# Patient Record
Sex: Female | Born: 1965 | Race: White | Hispanic: No | Marital: Married | Smoking: Never smoker
Health system: Southern US, Community
[De-identification: ages and names within clinical notes are randomized; demographics above are authoritative.]

## PROBLEM LIST (undated history)

## (undated) DIAGNOSIS — Z9889 Other specified postprocedural states: Secondary | ICD-10-CM

## (undated) HISTORY — PX: ANKLE SURGERY: SHX546

## (undated) HISTORY — PX: MOUTH SURGERY: SHX715

## (undated) HISTORY — DX: Other specified postprocedural states: Z98.890

## (undated) HISTORY — PX: COLONOSCOPY: SHX174

## (undated) HISTORY — PX: CHOLECYSTECTOMY: SHX55

---

## 2006-06-19 ENCOUNTER — Ambulatory Visit: Payer: Self-pay | Admitting: Unknown Physician Specialty

## 2007-09-01 ENCOUNTER — Ambulatory Visit: Payer: Self-pay | Admitting: Unknown Physician Specialty

## 2009-08-31 ENCOUNTER — Ambulatory Visit: Payer: Self-pay | Admitting: Unknown Physician Specialty

## 2009-09-02 ENCOUNTER — Ambulatory Visit: Payer: Self-pay | Admitting: Unknown Physician Specialty

## 2010-03-03 ENCOUNTER — Ambulatory Visit: Payer: Self-pay | Admitting: General Surgery

## 2010-09-20 ENCOUNTER — Ambulatory Visit: Payer: Self-pay | Admitting: Unknown Physician Specialty

## 2011-10-08 ENCOUNTER — Ambulatory Visit: Payer: Self-pay | Admitting: Unknown Physician Specialty

## 2013-03-25 ENCOUNTER — Ambulatory Visit: Payer: Self-pay | Admitting: Obstetrics and Gynecology

## 2014-11-23 ENCOUNTER — Ambulatory Visit: Payer: Self-pay | Admitting: Family Medicine

## 2014-11-23 LAB — LIPID PANEL
Cholesterol: 173 mg/dL (ref 0–200)
HDL: 65 mg/dL (ref 35–70)
LDL Cholesterol: 93 mg/dL
Triglycerides: 77 mg/dL (ref 40–160)

## 2014-11-23 LAB — HEPATIC FUNCTION PANEL
ALT: 11 U/L (ref 7–35)
AST: 15 U/L (ref 13–35)

## 2014-11-23 LAB — BASIC METABOLIC PANEL
BUN: 16 mg/dL (ref 4–21)
CREATININE: 0.8 mg/dL (ref 0.5–1.1)
Glucose: 94 mg/dL
Potassium: 4.5 mmol/L (ref 3.4–5.3)
SODIUM: 140 mmol/L (ref 137–147)

## 2014-11-23 LAB — CBC AND DIFFERENTIAL
HCT: 37 % (ref 36–46)
Hemoglobin: 13.6 g/dL (ref 12.0–16.0)
Platelets: 293 10*3/uL (ref 150–399)
WBC: 6 10^3/mL

## 2014-11-23 LAB — TSH: TSH: 4.22 u[IU]/mL (ref 0.41–5.90)

## 2015-07-19 DIAGNOSIS — B351 Tinea unguium: Secondary | ICD-10-CM | POA: Insufficient documentation

## 2015-07-19 DIAGNOSIS — E559 Vitamin D deficiency, unspecified: Secondary | ICD-10-CM | POA: Insufficient documentation

## 2015-07-19 DIAGNOSIS — R202 Paresthesia of skin: Secondary | ICD-10-CM | POA: Insufficient documentation

## 2015-07-19 DIAGNOSIS — M25551 Pain in right hip: Secondary | ICD-10-CM | POA: Insufficient documentation

## 2015-07-19 DIAGNOSIS — E538 Deficiency of other specified B group vitamins: Secondary | ICD-10-CM | POA: Insufficient documentation

## 2015-07-22 ENCOUNTER — Encounter: Payer: Self-pay | Admitting: Physician Assistant

## 2015-07-22 ENCOUNTER — Ambulatory Visit (INDEPENDENT_AMBULATORY_CARE_PROVIDER_SITE_OTHER): Payer: 59 | Admitting: Physician Assistant

## 2015-07-22 VITALS — BP 120/70 | HR 67 | Temp 98.1°F | Resp 16 | Ht 66.0 in | Wt 193.2 lb

## 2015-07-22 DIAGNOSIS — R635 Abnormal weight gain: Secondary | ICD-10-CM

## 2015-07-22 DIAGNOSIS — E559 Vitamin D deficiency, unspecified: Secondary | ICD-10-CM | POA: Diagnosis not present

## 2015-07-22 DIAGNOSIS — Z0000 Encounter for general adult medical examination without abnormal findings: Secondary | ICD-10-CM | POA: Diagnosis not present

## 2015-07-22 DIAGNOSIS — K5900 Constipation, unspecified: Secondary | ICD-10-CM

## 2015-07-22 DIAGNOSIS — E538 Deficiency of other specified B group vitamins: Secondary | ICD-10-CM | POA: Diagnosis not present

## 2015-07-22 NOTE — Patient Instructions (Signed)
Calorie Counting for Weight Loss Calories are energy you get from the things you eat and drink. Your body uses this energy to keep you going throughout the day. The number of calories you eat affects your weight. When you eat more calories than your body needs, your body stores the extra calories as fat. When you eat fewer calories than your body needs, your body burns fat to get the energy it needs. Calorie counting means keeping track of how many calories you eat and drink each day. If you make sure to eat fewer calories than your body needs, you should lose weight. In order for calorie counting to work, you will need to eat the number of calories that are right for you in a day to lose a healthy amount of weight per week. A healthy amount of weight to lose per week is usually 1-2 lb (0.5-0.9 kg). A dietitian can determine how many calories you need in a day and give you suggestions on how to reach your calorie goal.  WHAT IS MY MY PLAN? My goal is to have 1200-1500 calories per day.  If I have this many calories per day, I should lose around 1-2 pounds per week. WHAT DO I NEED TO KNOW ABOUT CALORIE COUNTING? In order to meet your daily calorie goal, you will need to:  Find out how many calories are in each food you would like to eat. Try to do this before you eat.  Decide how much of the food you can eat.  Write down what you ate and how many calories it had. Doing this is called keeping a food log. WHERE DO I FIND CALORIE INFORMATION? The number of calories in a food can be found on a Nutrition Facts label. Note that all the information on a label is based on a specific serving of the food. If a food does not have a Nutrition Facts label, try to look up the calories online or ask your dietitian for help. HOW DO I DECIDE HOW MUCH TO EAT? To decide how much of the food you can eat, you will need to consider both the number of calories in one serving and the size of one serving. This information  can be found on the Nutrition Facts label. If a food does not have a Nutrition Facts label, look up the information online or ask your dietitian for help. Remember that calories are listed per serving. If you choose to have more than one serving of a food, you will have to multiply the calories per serving by the amount of servings you plan to eat. For example, the label on a package of bread might say that a serving size is 1 slice and that there are 90 calories in a serving. If you eat 1 slice, you will have eaten 90 calories. If you eat 2 slices, you will have eaten 180 calories. HOW DO I KEEP A FOOD LOG? After each meal, record the following information in your food log:  What you ate.  How much of it you ate.  How many calories it had.  Then, add up your calories. Keep your food log near you, such as in a small notebook in your pocket. Another option is to use a mobile app or website. Some programs will calculate calories for you and show you how many calories you have left each time you add an item to the log. WHAT ARE SOME CALORIE COUNTING TIPS?  Use your calories on foods  and drinks that will fill you up and not leave you hungry. Some examples of this include foods like nuts and nut butters, vegetables, lean proteins, and high-fiber foods (more than 5 g fiber per serving).  Eat nutritious foods and avoid empty calories. Empty calories are calories you get from foods or beverages that do not have many nutrients, such as candy and soda. It is better to have a nutritious high-calorie food (such as an avocado) than a food with few nutrients (such as a bag of chips).  Know how many calories are in the foods you eat most often. This way, you do not have to look up how many calories they have each time you eat them.  Look out for foods that may seem like low-calorie foods but are really high-calorie foods, such as baked goods, soda, and fat-free candy.  Pay attention to calories in drinks.  Drinks such as sodas, specialty coffee drinks, alcohol, and juices have a lot of calories yet do not fill you up. Choose low-calorie drinks like water and diet drinks.  Focus your calorie counting efforts on higher calorie items. Logging the calories in a garden salad that contains only vegetables is less important than calculating the calories in a milk shake.  Find a way of tracking calories that works for you. Get creative. Most people who are successful find ways to keep track of how much they eat in a day, even if they do not count every calorie. WHAT ARE SOME PORTION CONTROL TIPS?  Know how many calories are in a serving. This will help you know how many servings of a certain food you can have.  Use a measuring cup to measure serving sizes. This is helpful when you start out. With time, you will be able to estimate serving sizes for some foods.  Take some time to put servings of different foods on your favorite plates, bowls, and cups so you know what a serving looks like.  Try not to eat straight from a bag or box. Doing this can lead to overeating. Put the amount you would like to eat in a cup or on a plate to make sure you are eating the right portion.  Use smaller plates, glasses, and bowls to prevent overeating. This is a quick and easy way to practice portion control. If your plate is smaller, less food can fit on it.  Try not to multitask while eating, such as watching TV or using your computer. If it is time to eat, sit down at a table and enjoy your food. Doing this will help you to start recognizing when you are full. It will also make you more aware of what and how much you are eating. HOW CAN I CALORIE COUNT WHEN EATING OUT?  Ask for smaller portion sizes or child-sized portions.  Consider sharing an entree and sides instead of getting your own entree.  If you get your own entree, eat only half. Ask for a box at the beginning of your meal and put the rest of your entree in  it so you are not tempted to eat it.  Look for the calories on the menu. If calories are listed, choose the lower calorie options.  Choose dishes that include vegetables, fruits, whole grains, low-fat dairy products, and lean protein. Focusing on smart food choices from each of the 5 food groups can help you stay on track at restaurants.  Choose items that are boiled, broiled, grilled, or steamed.  Choose  water, milk, unsweetened iced tea, or other drinks without added sugars. If you want an alcoholic beverage, choose a lower calorie option. For example, a regular margarita can have up to 700 calories and a glass of wine has around 150.  Stay away from items that are buttered, battered, fried, or served with cream sauce. Items labeled "crispy" are usually fried, unless stated otherwise.  Ask for dressings, sauces, and syrups on the side. These are usually very high in calories, so do not eat much of them.  Watch out for salads. Many people think salads are a healthy option, but this is often not the case. Many salads come with bacon, fried chicken, lots of cheese, fried chips, and dressing. All of these items have a lot of calories. If you want a salad, choose a garden salad and ask for grilled meats or steak. Ask for the dressing on the side, or ask for olive oil and vinegar or lemon to use as dressing.  Estimate how many servings of a food you are given. For example, a serving of cooked rice is  cup or about the size of half a tennis ball or one cupcake wrapper. Knowing serving sizes will help you be aware of how much food you are eating at restaurants. The list below tells you how big or small some common portion sizes are based on everyday objects.  1 oz--4 stacked dice.  3 oz--1 deck of cards.  1 tsp--1 dice.  1 Tbsp-- a Ping-Pong ball.  2 Tbsp--1 Ping-Pong ball.   cup--1 tennis ball or 1 cupcake wrapper.  1 cup--1 baseball.   This information is not intended to replace advice  given to you by your health care provider. Make sure you discuss any questions you have with your health care provider.   Document Released: 10/01/2005 Document Revised: 10/22/2014 Document Reviewed: 08/06/2013 Elsevier Interactive Patient Education 2016 [employer]. Exercising to [employer] Exercising can help you to lose weight. In order to lose weight through exercise, you need to do vigorous-intensity exercise. You can tell that you are exercising with vigorous intensity if you are breathing very hard and fast and cannot hold a conversation while exercising. Moderate-intensity exercise helps to maintain your current weight. You can tell that you are exercising at a moderate level if you have a higher heart rate and faster breathing, but you are still able to hold a conversation. HOW OFTEN SHOULD I EXERCISE? Choose an activity that you enjoy and set realistic goals. Your health care provider can help you to make an activity plan that works for you. Exercise regularly as directed by your health care provider. This may include:  Doing resistance training twice each week, such as:  Push-ups.  Sit-ups.  Lifting weights.  Using resistance bands.  Doing a given intensity of exercise for a given amount of time. Choose from these options:  150 minutes of moderate-intensity exercise every week.  75 minutes of vigorous-intensity exercise every week.  A mix of moderate-intensity and vigorous-intensity exercise every week. Children, pregnant women, people who are out of shape, people who are overweight, and older adults may need to consult a health care provider for individual recommendations. If you have any sort of medical condition, be sure to consult your health care provider before starting a new exercise program. WHAT ARE SOME ACTIVITIES THAT CAN HELP ME TO LOSE WEIGHT?   Walking at a rate of at least 4.5 miles an hour.  Jogging or running at a rate of  5 miles per hour.  Biking at a  rate of at least 10 miles per hour.  Lap swimming.  Roller-skating or in-line skating.  Cross-country skiing.  Vigorous competitive sports, such as football, basketball, and soccer.  Jumping rope.  Aerobic dancing. HOW CAN I BE MORE ACTIVE IN MY DAY-TO-DAY ACTIVITIES?  Use the stairs instead of the elevator.  Take a walk during your lunch break.  If you drive, park your car farther away from work or school.  If you take public transportation, get off one stop early and walk the rest of the way.  Make all of your phone calls while standing up and walking around.  Get up, stretch, and walk around every 30 minutes throughout the day. WHAT GUIDELINES SHOULD I FOLLOW WHILE EXERCISING?  Do not exercise so much that you hurt yourself, feel dizzy, or get very short of breath.  Consult your health care provider prior to starting a new exercise program.  Wear comfortable clothes and shoes with good support.  Drink plenty of water while you exercise to prevent dehydration or heat stroke. Body water is lost during exercise and must be replaced.  Work out until you breathe faster and your heart beats faster.   This information is not intended to replace advice given to you by your health care provider. Make sure you discuss any questions you have with your health care provider.   Document Released: 11/03/2010 Document Revised: 10/22/2014 Document Reviewed: 03/04/2014 Elsevier Interactive Patient Education [employer].
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
## 2015-07-23 LAB — THYROID PANEL WITH TSH
Free Thyroxine Index: 2 (ref 1.2–4.9)
T3 UPTAKE RATIO: 26 % (ref 24–39)
T4, Total: 7.8 ug/dL (ref 4.5–12.0)
TSH: 1.95 u[IU]/mL (ref 0.450–4.500)

## 2015-07-23 LAB — VITAMIN B12: Vitamin B-12: 1172 pg/mL — ABNORMAL HIGH (ref 211–946)

## 2015-07-23 LAB — FSH/LH
FSH: 6.4 m[IU]/mL
LH: 12.5 m[IU]/mL

## 2015-07-23 LAB — VITAMIN D 25 HYDROXY (VIT D DEFICIENCY, FRACTURES): VIT D 25 HYDROXY: 41.1 ng/mL (ref 30.0–100.0)

## 2015-07-25 ENCOUNTER — Telehealth: Payer: Self-pay

## 2015-07-25 NOTE — Telephone Encounter (Signed)
-----   Message from Mar Daring, PA-C sent at 07/25/2015  9:40 AM EDT ----- All labs are WNL.  [facility] and LH show not in menopause yet, showing follicular stage of menstrual cycle.  Vit B12 is elevated.  May discontinue B12 supplement and we will recheck.

## 2015-07-25 NOTE — Telephone Encounter (Signed)
Advised pt as directed. Pt verbalized fully understanding.  Thanks,   

## 2015-07-25 NOTE — Telephone Encounter (Signed)
Advised pt as directed. Pt verbalized fully understanding. Pt wants to know when to recheck her lab work.  Thanks,

## 2015-07-25 NOTE — Telephone Encounter (Signed)
We can recheck in 3-6 months.

## 2015-12-14 ENCOUNTER — Encounter: Payer: Self-pay | Admitting: Family Medicine

## 2016-07-30 ENCOUNTER — Ambulatory Visit: Payer: 59 | Admitting: Physician Assistant

## 2016-08-02 ENCOUNTER — Ambulatory Visit (INDEPENDENT_AMBULATORY_CARE_PROVIDER_SITE_OTHER): Payer: 59 | Admitting: Physician Assistant

## 2016-08-02 ENCOUNTER — Encounter: Payer: Self-pay | Admitting: Physician Assistant

## 2016-08-02 VITALS — BP 110/70 | HR 76 | Temp 98.5°F | Resp 16 | Ht 66.0 in | Wt 187.0 lb

## 2016-08-02 DIAGNOSIS — Z6830 Body mass index (BMI) 30.0-30.9, adult: Secondary | ICD-10-CM | POA: Diagnosis not present

## 2016-08-02 DIAGNOSIS — N951 Menopausal and female climacteric states: Secondary | ICD-10-CM | POA: Diagnosis not present

## 2016-08-02 DIAGNOSIS — Z713 Dietary counseling and surveillance: Secondary | ICD-10-CM

## 2016-08-02 DIAGNOSIS — E6609 Other obesity due to excess calories: Secondary | ICD-10-CM

## 2016-08-02 DIAGNOSIS — E66811 Obesity, class 1: Secondary | ICD-10-CM

## 2016-08-02 NOTE — Progress Notes (Signed)
       Patient: Nina Dunn Female    DOB: 1965-11-29   50 y.o.   MRN: [id number] Visit Date: 08/02/2016  Today's Provider: Mar Daring, PA-C   Chief Complaint  Patient presents with  . Follow-up    weight    Subjective:    HPI  Follow up for weight  The patient was last seen for this 1 years ago. Changes made at last visit include pt to continue healthy lifestyle changes. She has been eating healthy, portion controlling and exercising at the [facility] (pilates and water aerobics).   She reports excellent compliance with treatment. She feels that condition is Unchanged.  She also complains of irregular menstruation. She does have an IUD. She is seen by [facility]. She used to have normal cycles, but of recent has had her current menstrual cycle for 3-4 weeks. It has only been spotting. She is going to call [facility] for follow-up. Sees Alicia Copland, PA-C. ------------------------------------------------------------------------------------       No Known Allergies  No current outpatient prescriptions on file.  Review of Systems  Constitutional: Negative.   Respiratory: Negative.   Cardiovascular: Negative.   Gastrointestinal: Negative.   Genitourinary: Positive for menstrual problem.  Neurological: Negative.     Social History  Substance Use Topics  . Smoking status: Never Smoker  . Smokeless tobacco: Never Used  . Alcohol use No   Objective:   BP 110/70 (BP Location: Left Arm, Patient Position: Sitting, Cuff Size: Large)   Pulse 76   Temp 98.5 F (36.9 C) (Oral)   Resp 16   Ht 5\' 6"  (1.676 m)   Wt 187 lb (84.8 kg)   LMP 07/04/2016   BMI 30.18 kg/m   Physical Exam  Constitutional: She appears well-developed and well-nourished. No distress.  Neck: Normal range of motion. Neck supple. No tracheal deviation present. No thyromegaly present.  Cardiovascular: Normal rate, regular rhythm and normal heart sounds.  Exam reveals no gallop and no  friction rub.   No murmur heard. Pulmonary/Chest: Effort normal and breath sounds normal. No respiratory distress. She has no wheezes. She has no rales.  Musculoskeletal: She exhibits no edema.  Lymphadenopathy:    She has no cervical adenopathy.  Skin: She is not diaphoretic.  Psychiatric: She has a normal mood and affect. Her behavior is normal. Judgment and thought content normal.  Vitals reviewed.     Assessment & Plan:     1. Encounter for weight loss counseling Biometric appeal form completed for patient.   2. Class 1 obesity due to excess calories without serious comorbidity with body mass index (BMI) of 30.0 to 30.9 in adult Doing well. Down 5 pounds from previous. Biometric appeals form completed.  3. Perimenopause Most likely perimenopausal symptoms. She is going to follow up with [facility].       Mar Daring, PA-C  [facility]

## 2016-08-02 NOTE — Patient Instructions (Signed)

## 2016-12-06 DIAGNOSIS — K2900 Acute gastritis without bleeding: Secondary | ICD-10-CM | POA: Insufficient documentation

## 2016-12-06 DIAGNOSIS — D369 Benign neoplasm, unspecified site: Secondary | ICD-10-CM | POA: Insufficient documentation

## 2016-12-13 ENCOUNTER — Emergency Department (HOSPITAL_COMMUNITY): Payer: 59

## 2016-12-13 ENCOUNTER — Encounter (HOSPITAL_COMMUNITY): Payer: Self-pay | Admitting: *Deleted

## 2016-12-13 ENCOUNTER — Emergency Department (HOSPITAL_COMMUNITY)
Admission: EM | Admit: 2016-12-13 | Discharge: 2016-12-13 | Disposition: A | Discharge status: Home / Self Care | Payer: 59 | Attending: Emergency Medicine | Admitting: Emergency Medicine

## 2016-12-13 DIAGNOSIS — Z79899 Other long term (current) drug therapy: Secondary | ICD-10-CM | POA: Diagnosis not present

## 2016-12-13 DIAGNOSIS — R0789 Other chest pain: Secondary | ICD-10-CM | POA: Diagnosis not present

## 2016-12-13 DIAGNOSIS — R0781 Pleurodynia: Secondary | ICD-10-CM | POA: Diagnosis present

## 2016-12-13 DIAGNOSIS — R079 Chest pain, unspecified: Secondary | ICD-10-CM

## 2016-12-13 LAB — BASIC METABOLIC PANEL
Anion gap: 7 (ref 5–15)
BUN: 14 mg/dL (ref 6–20)
CHLORIDE: 106 mmol/L (ref 101–111)
CO2: 24 mmol/L (ref 22–32)
Calcium: 9 mg/dL (ref 8.9–10.3)
Creatinine, Ser: 0.83 mg/dL (ref 0.44–1.00)
Glucose, Bld: 95 mg/dL (ref 65–99)
POTASSIUM: 3.7 mmol/L (ref 3.5–5.1)
SODIUM: 137 mmol/L (ref 135–145)

## 2016-12-13 LAB — CBC
HEMATOCRIT: 36.7 % (ref 36.0–46.0)
Hemoglobin: 12.5 g/dL (ref 12.0–15.0)
MCH: 30.8 pg (ref 26.0–34.0)
MCHC: 34.1 g/dL (ref 30.0–36.0)
MCV: 90.4 fL (ref 78.0–100.0)
PLATELETS: 275 10*3/uL (ref 150–400)
RBC: 4.06 MIL/uL (ref 3.87–5.11)
RDW: 12.9 % (ref 11.5–15.5)
WBC: 7 10*3/uL (ref 4.0–10.5)

## 2016-12-13 LAB — I-STAT TROPONIN, ED: Troponin i, poc: 0 ng/mL (ref 0.00–0.08)
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
## 2016-12-13 NOTE — ED Triage Notes (Signed)
Pt co R sided chest pain x 3 days, described as sharp shooting pains. Pt reports worsening pain tonight that woke her from sleep

## 2016-12-21 ENCOUNTER — Ambulatory Visit: Payer: Self-pay | Admitting: Advanced Practice Midwife

## 2017-01-08 ENCOUNTER — Other Ambulatory Visit: Payer: Self-pay | Admitting: Obstetrics and Gynecology

## 2017-01-08 DIAGNOSIS — Z1231 Encounter for screening mammogram for malignant neoplasm of breast: Secondary | ICD-10-CM

## 2017-01-10 ENCOUNTER — Ambulatory Visit (INDEPENDENT_AMBULATORY_CARE_PROVIDER_SITE_OTHER): Payer: 59 | Admitting: Advanced Practice Midwife

## 2017-01-10 ENCOUNTER — Ambulatory Visit: Admission: RE | Admit: 2017-01-10 | Payer: 59 | Source: Ambulatory Visit

## 2017-01-10 ENCOUNTER — Encounter: Payer: Self-pay | Admitting: Advanced Practice Midwife

## 2017-01-10 VITALS — BP 116/74 | HR 64 | Ht 66.0 in | Wt 194.0 lb

## 2017-01-10 DIAGNOSIS — Z124 Encounter for screening for malignant neoplasm of cervix: Secondary | ICD-10-CM

## 2017-01-10 DIAGNOSIS — Z01419 Encounter for gynecological examination (general) (routine) without abnormal findings: Secondary | ICD-10-CM

## 2017-01-10 DIAGNOSIS — R0789 Other chest pain: Secondary | ICD-10-CM | POA: Insufficient documentation
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
## 2017-01-13 LAB — PAP IG (IMAGE GUIDED): PAP Smear Comment: 0

## 2017-01-14 ENCOUNTER — Ambulatory Visit
Admission: RE | Admit: 2017-01-14 | Discharge: 2017-01-14 | Disposition: A | Discharge status: Home / Self Care | Payer: 59 | Source: Ambulatory Visit | Attending: Obstetrics and Gynecology | Admitting: Obstetrics and Gynecology

## 2017-01-14 DIAGNOSIS — Z1231 Encounter for screening mammogram for malignant neoplasm of breast: Secondary | ICD-10-CM | POA: Insufficient documentation

## 2017-01-15 ENCOUNTER — Encounter: Payer: Self-pay | Admitting: Obstetrics and Gynecology

## 2017-02-01 ENCOUNTER — Ambulatory Visit: Payer: 59

## 2018-01-25 IMAGING — DX DG ABDOMEN 1V
1 series · 1 of 1 positions shown · non-contrast
Comparison: None.

CLINICAL DATA: RIGHT-sided abdominal epigastric pain for 3 days.

EXAM:
ABDOMEN - 1 VIEW

[abdomen kub]
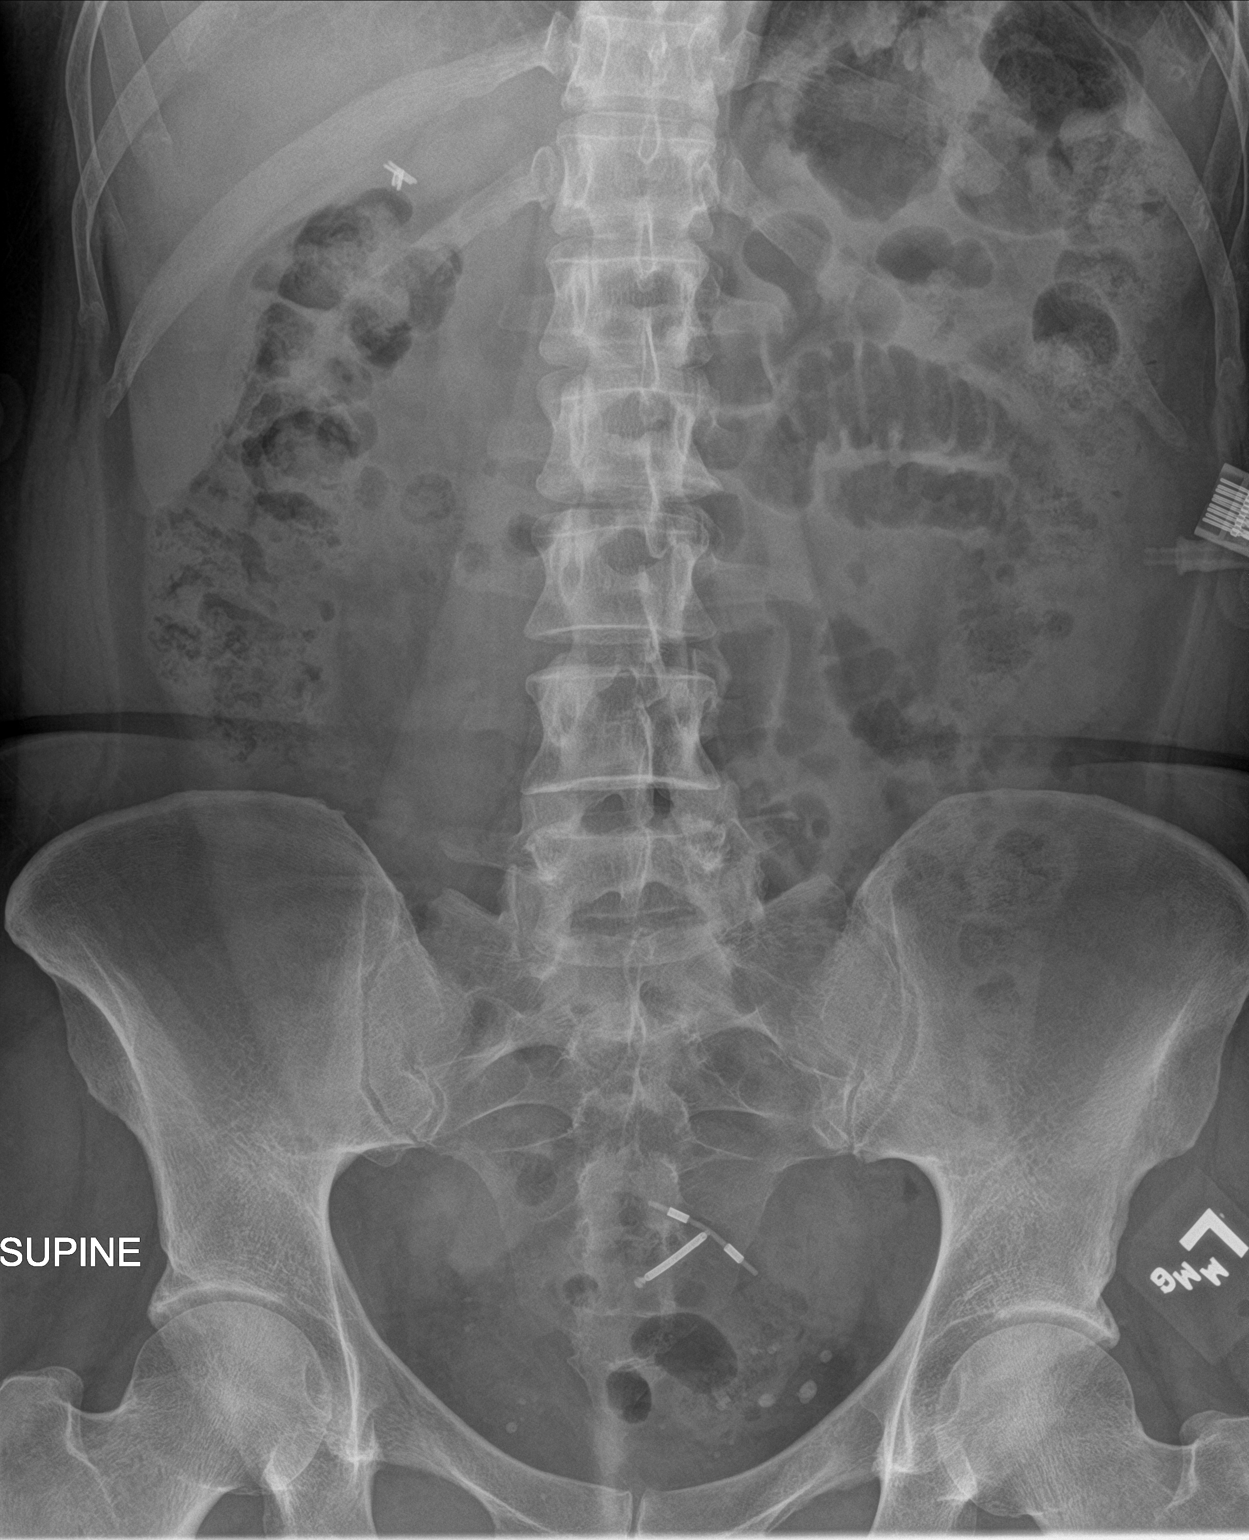

[1 of 1 positions shown; findings below may reference images not displayed]

FINDINGS: Bowel gas pattern is nondilated and nonobstructive. No
intra-abdominal mass effect or pathologic calcifications.
Phleboliths project in the pelvis. IUD in central pelvis. Surgical
clips in the included right abdomen compatible with cholecystectomy.
Soft tissue planes and included osseous structures are normal.
IMPRESSION: Normal bowel gas pattern.

## 2018-01-25 IMAGING — CR DG CHEST 2V
2 series · 2 of 2 positions shown · non-contrast
Comparison: None.

CLINICAL DATA: Acute onset of right-sided chest pain. Initial
encounter.

EXAM:
CHEST  2 VIEW

[chest pa]
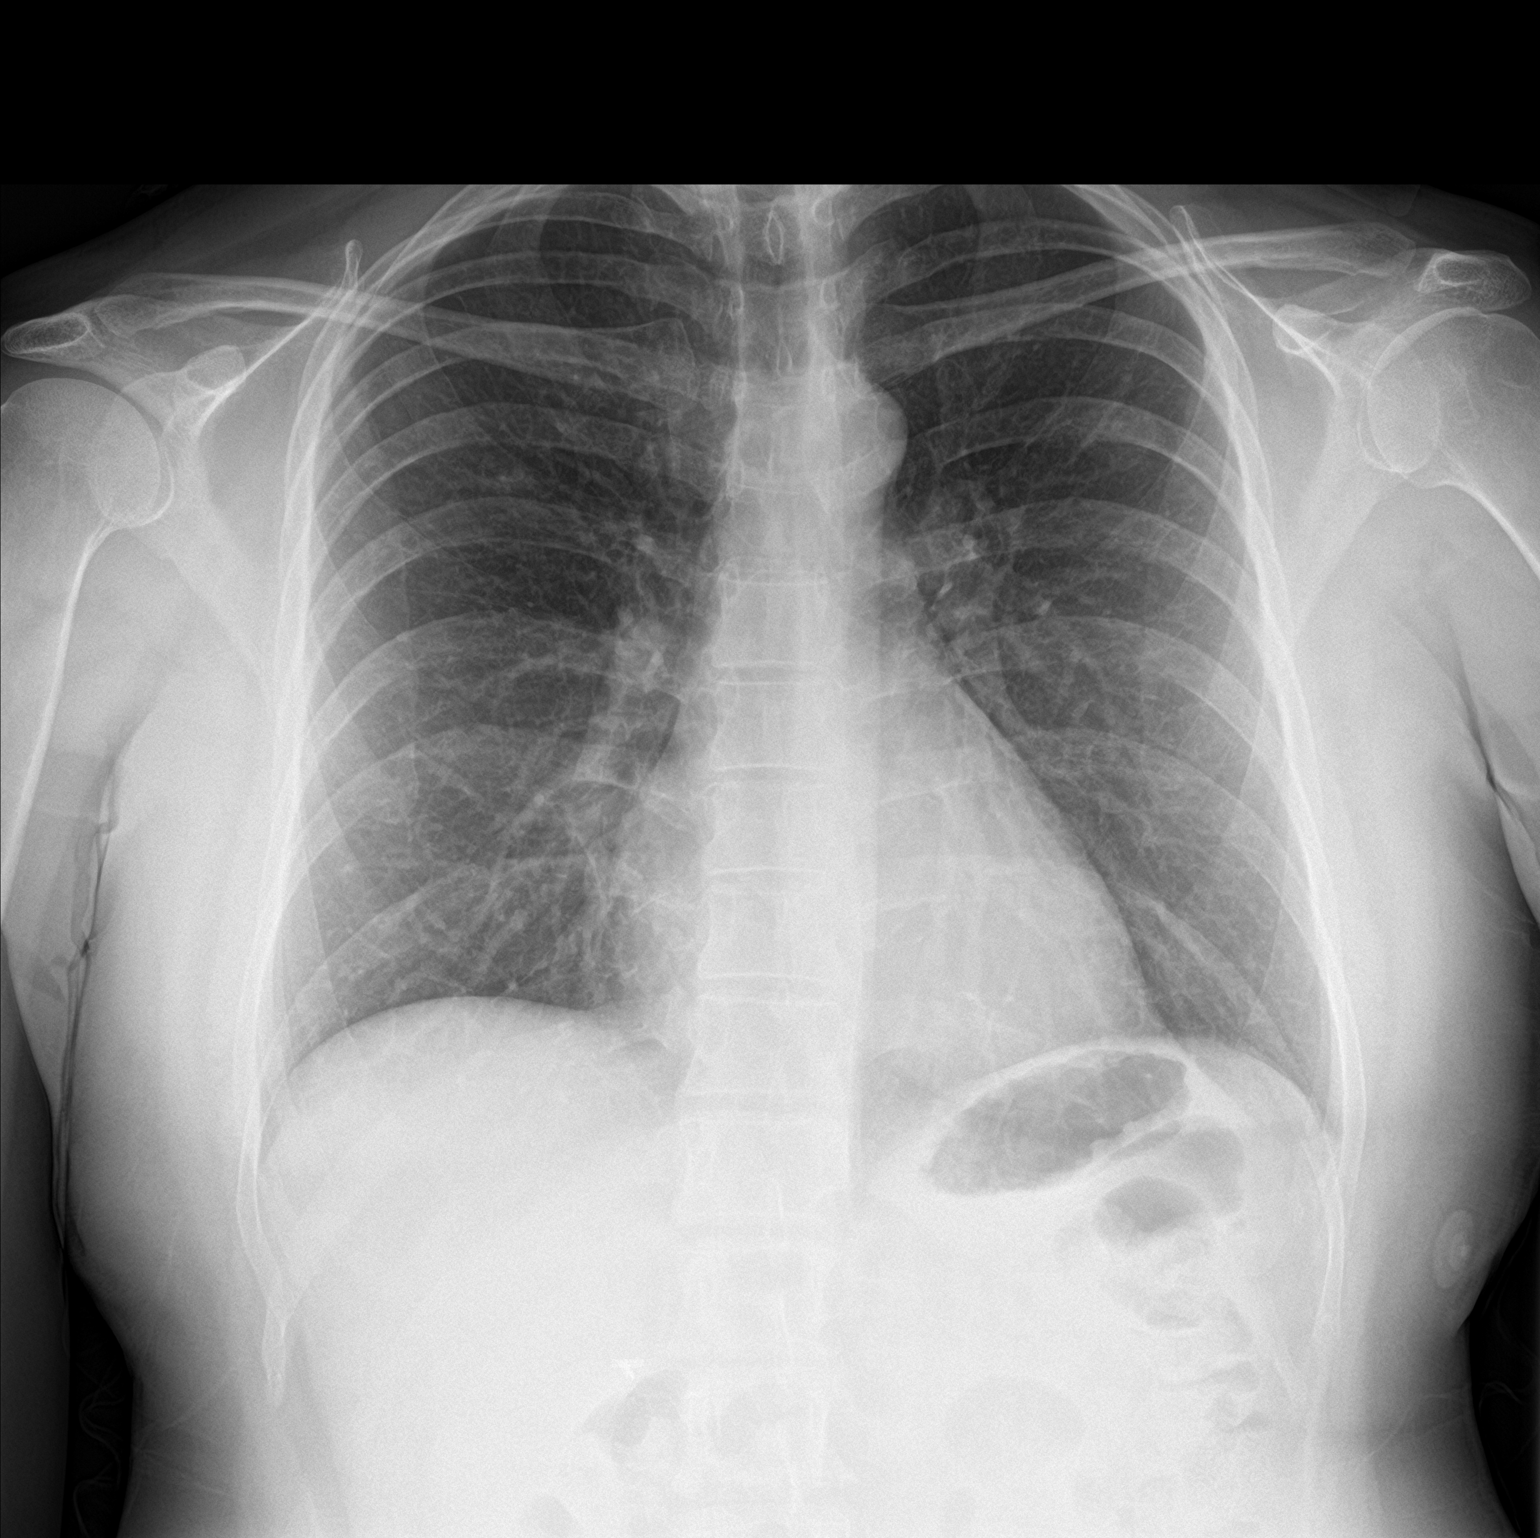

[chest lat]
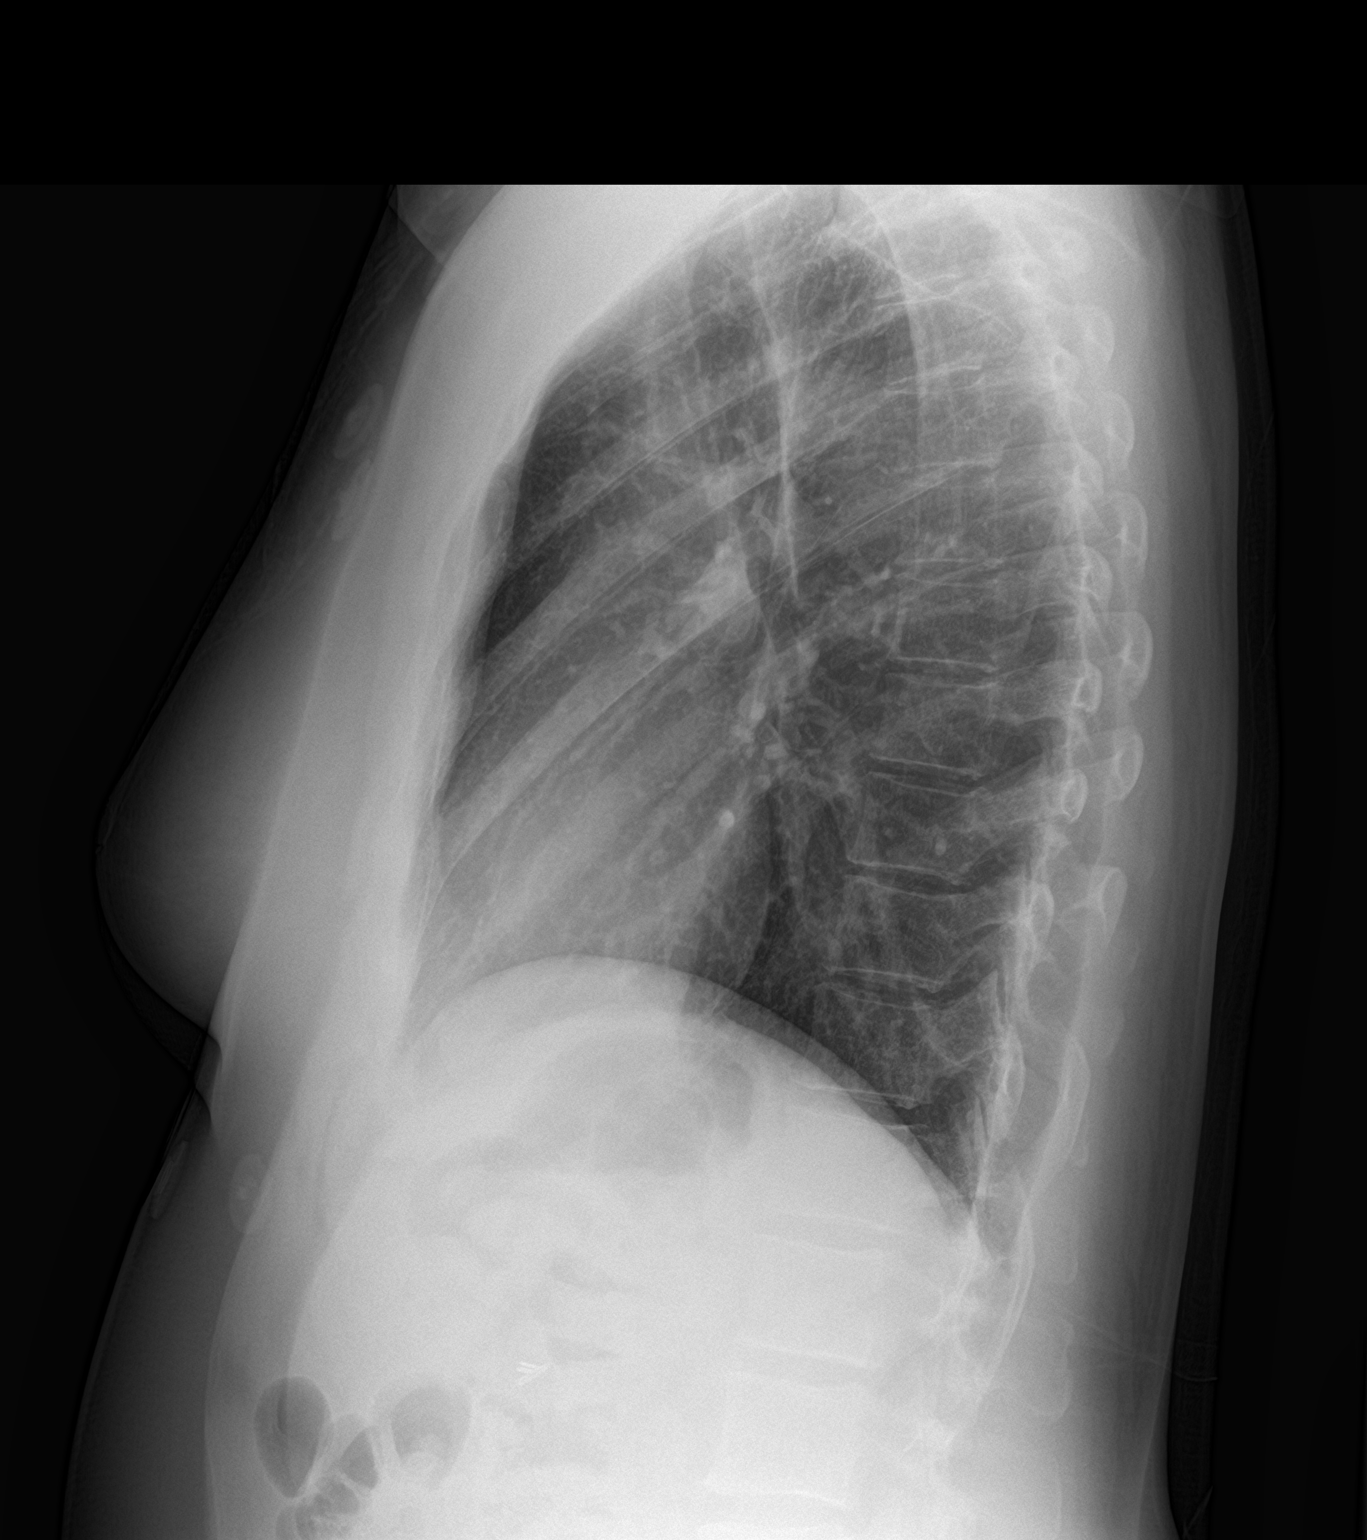

[2 of 2 positions shown; findings below may reference images not displayed]

FINDINGS: The lungs are well-aerated and clear. There is no evidence of focal
opacification, pleural effusion or pneumothorax.

The heart is normal in size; the mediastinal contour is within
normal limits. No acute osseous abnormalities are seen.
IMPRESSION: No acute cardiopulmonary process seen. No displaced rib fractures
identified.

## 2019-04-28 ENCOUNTER — Other Ambulatory Visit: Payer: Self-pay | Admitting: Internal Medicine

## 2019-04-28 DIAGNOSIS — Z1231 Encounter for screening mammogram for malignant neoplasm of breast: Secondary | ICD-10-CM

## 2019-05-11 ENCOUNTER — Ambulatory Visit
Admission: RE | Admit: 2019-05-11 | Discharge: 2019-05-11 | Disposition: A | Discharge status: Home / Self Care | Payer: Managed Care, Other (non HMO) | Source: Ambulatory Visit | Attending: Internal Medicine | Admitting: Internal Medicine

## 2019-05-11 ENCOUNTER — Encounter (INDEPENDENT_AMBULATORY_CARE_PROVIDER_SITE_OTHER): Payer: Self-pay

## 2019-05-11 ENCOUNTER — Other Ambulatory Visit: Payer: Self-pay

## 2019-05-11 DIAGNOSIS — Z1231 Encounter for screening mammogram for malignant neoplasm of breast: Secondary | ICD-10-CM | POA: Insufficient documentation

## 2019-05-13 ENCOUNTER — Encounter: Payer: Self-pay | Admitting: Obstetrics & Gynecology

## 2019-05-13 ENCOUNTER — Other Ambulatory Visit: Payer: Self-pay | Admitting: Internal Medicine

## 2019-05-13 ENCOUNTER — Ambulatory Visit (INDEPENDENT_AMBULATORY_CARE_PROVIDER_SITE_OTHER): Payer: Managed Care, Other (non HMO) | Admitting: Obstetrics & Gynecology

## 2019-05-13 ENCOUNTER — Other Ambulatory Visit: Payer: Self-pay

## 2019-05-13 VITALS — BP 120/80 | Ht 67.0 in | Wt 194.0 lb

## 2019-05-13 DIAGNOSIS — R928 Other abnormal and inconclusive findings on diagnostic imaging of breast: Secondary | ICD-10-CM

## 2019-05-13 DIAGNOSIS — Z01419 Encounter for gynecological examination (general) (routine) without abnormal findings: Secondary | ICD-10-CM | POA: Diagnosis not present

## 2019-05-13 DIAGNOSIS — N6489 Other specified disorders of breast: Secondary | ICD-10-CM

## 2019-05-13 NOTE — Patient Instructions (Addendum)
PAP every three years, due 2021 Mammogram every year    Call [phone] to schedule at [facility] Colonoscopy every 3 years per gastroenterology Labs yearly (with PCP)
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
## 2019-05-22 ENCOUNTER — Ambulatory Visit
Admission: RE | Admit: 2019-05-22 | Discharge: 2019-05-22 | Disposition: A | Discharge status: Home / Self Care | Payer: Managed Care, Other (non HMO) | Source: Ambulatory Visit | Attending: Internal Medicine | Admitting: Internal Medicine

## 2019-05-22 ENCOUNTER — Other Ambulatory Visit: Payer: Self-pay

## 2019-05-22 DIAGNOSIS — N6489 Other specified disorders of breast: Secondary | ICD-10-CM | POA: Diagnosis present

## 2019-05-22 DIAGNOSIS — R928 Other abnormal and inconclusive findings on diagnostic imaging of breast: Secondary | ICD-10-CM

## 2020-06-13 ENCOUNTER — Encounter: Payer: Self-pay | Admitting: Obstetrics & Gynecology

## 2020-06-13 ENCOUNTER — Other Ambulatory Visit: Payer: Self-pay

## 2020-06-13 ENCOUNTER — Ambulatory Visit (INDEPENDENT_AMBULATORY_CARE_PROVIDER_SITE_OTHER): Payer: Managed Care, Other (non HMO) | Admitting: Obstetrics & Gynecology

## 2020-06-13 ENCOUNTER — Other Ambulatory Visit (HOSPITAL_COMMUNITY)
Admission: RE | Admit: 2020-06-13 | Discharge: 2020-06-13 | Disposition: A | Discharge status: Home / Self Care | Payer: Managed Care, Other (non HMO) | Source: Ambulatory Visit | Attending: Obstetrics & Gynecology | Admitting: Obstetrics & Gynecology

## 2020-06-13 VITALS — BP 130/80 | Ht 67.0 in | Wt 197.0 lb

## 2020-06-13 DIAGNOSIS — Z01419 Encounter for gynecological examination (general) (routine) without abnormal findings: Secondary | ICD-10-CM

## 2020-06-13 DIAGNOSIS — Z780 Asymptomatic menopausal state: Secondary | ICD-10-CM

## 2020-06-13 DIAGNOSIS — Z1231 Encounter for screening mammogram for malignant neoplasm of breast: Secondary | ICD-10-CM | POA: Diagnosis not present

## 2020-06-13 DIAGNOSIS — Z124 Encounter for screening for malignant neoplasm of cervix: Secondary | ICD-10-CM | POA: Insufficient documentation
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
## 2020-06-13 NOTE — Addendum Note (Signed)
Addended by: Gae Dry on: 06/13/2020 02:18 PM   Modules accepted: Orders

## 2020-06-13 NOTE — Patient Instructions (Signed)
PAP every three years °Mammogram every year °   Call [phone] to schedule at [place] °Colonoscopy every 3 years °Labs yearly (with PCP) ° °Thank you for choosing [facility]. As part of our ongoing efforts to improve patient experience, we would appreciate your feedback. Please fill out the short survey that you will receive by mail or MyChart. Your opinion is important to us! °- Dr. Dierre Crevier ° °

## 2020-06-15 LAB — CYTOLOGY - PAP
Comment: NEGATIVE
Diagnosis: NEGATIVE
High risk HPV: NEGATIVE

## 2020-07-03 IMAGING — MG DIGITAL DIAGNOSTIC BILATERAL MAMMOGRAM WITH TOMO AND CAD
8 series · 8 of 24 positions shown · non-contrast
Comparison: Previous exam(s).

CLINICAL DATA: Patient was recalled from screening for bilateral
asymmetries

EXAM:
DIGITAL DIAGNOSTIC BILATERAL MAMMOGRAM WITH CAD AND TOMO

[R ML synth-2D]
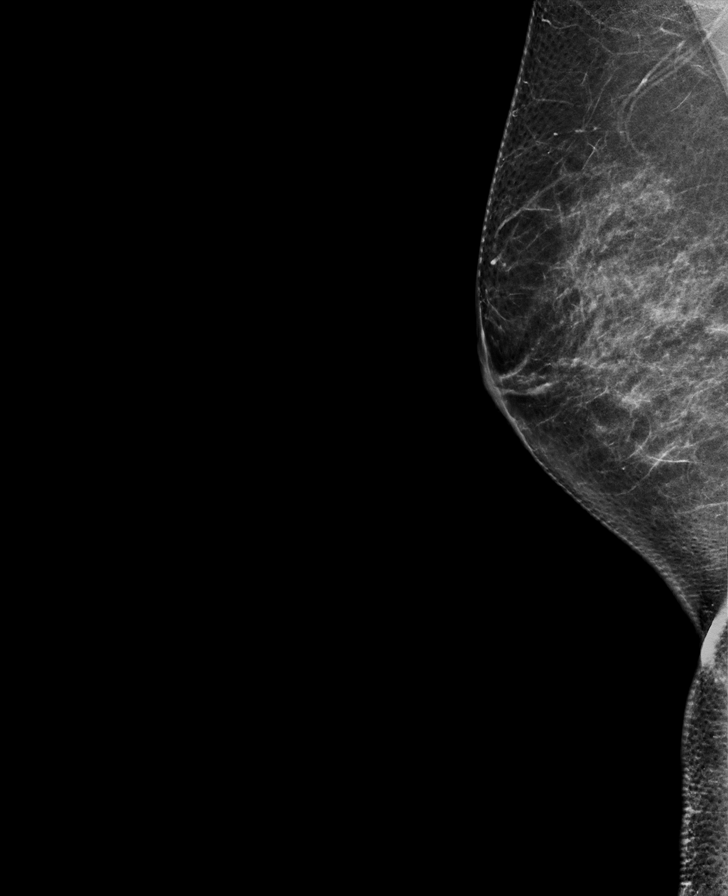

[R MLO synth-2D]
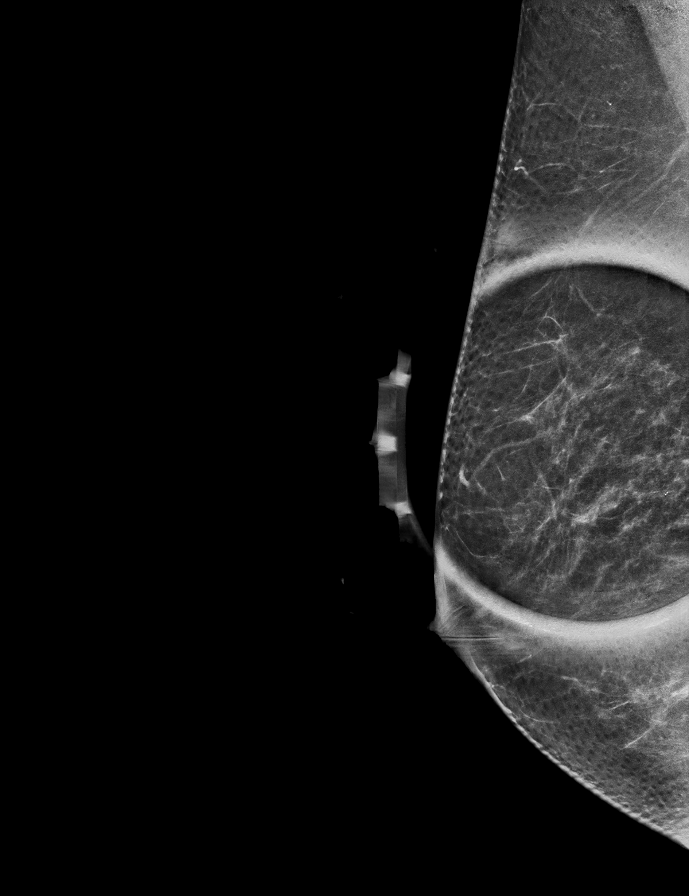

[L ML synth-2D]
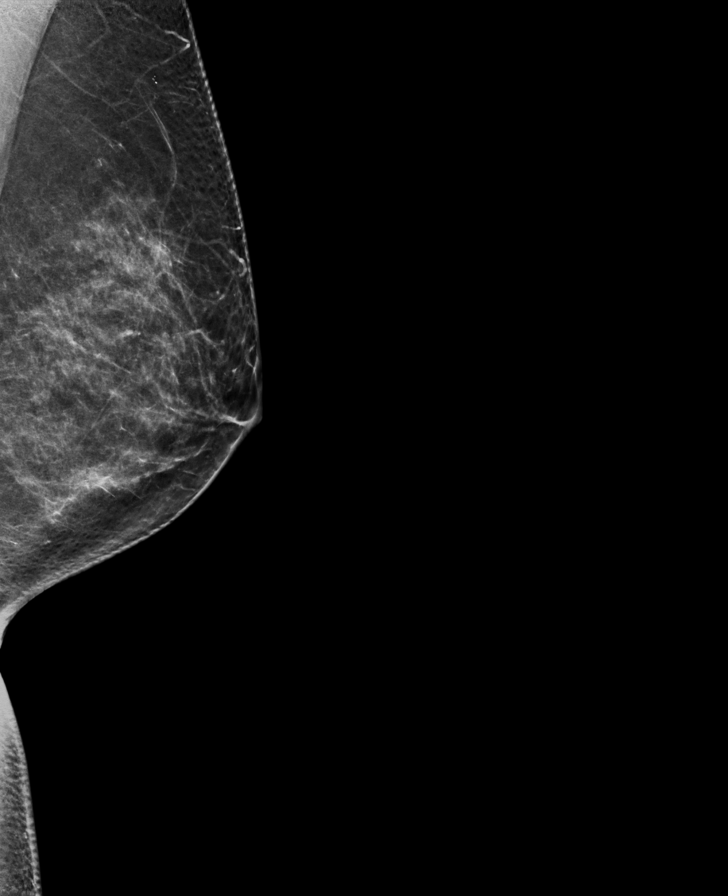

[L MLO synth-2D]
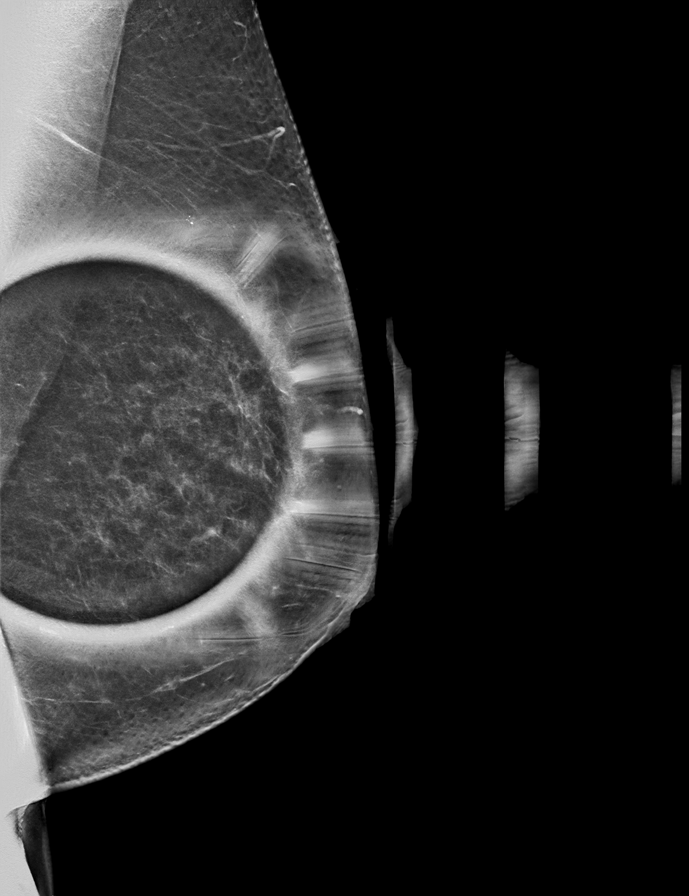

[L MLO tomo · tomo slice 41/80.0]
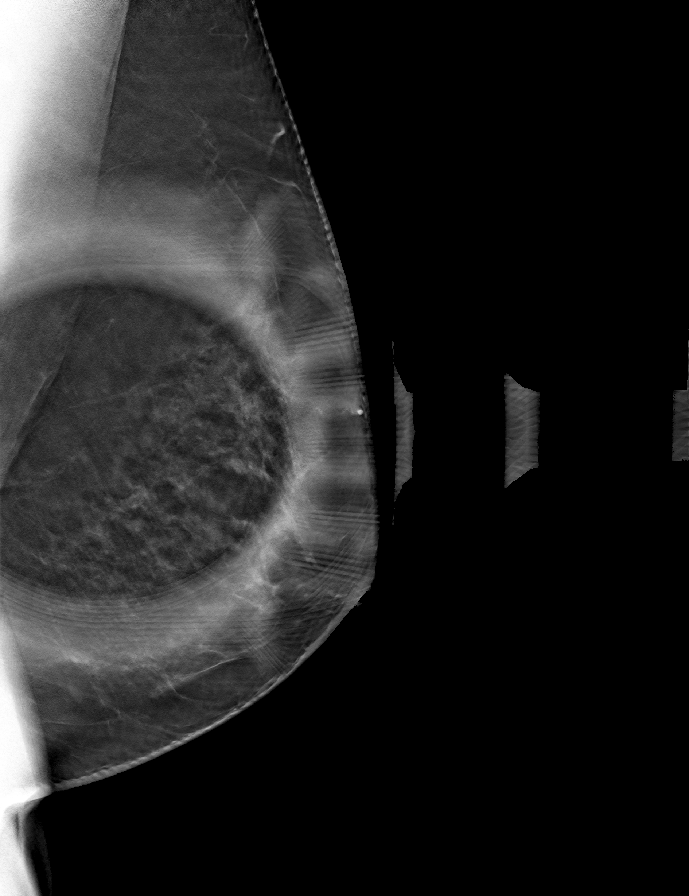

[R ML tomo · tomo slice 35/68.0]
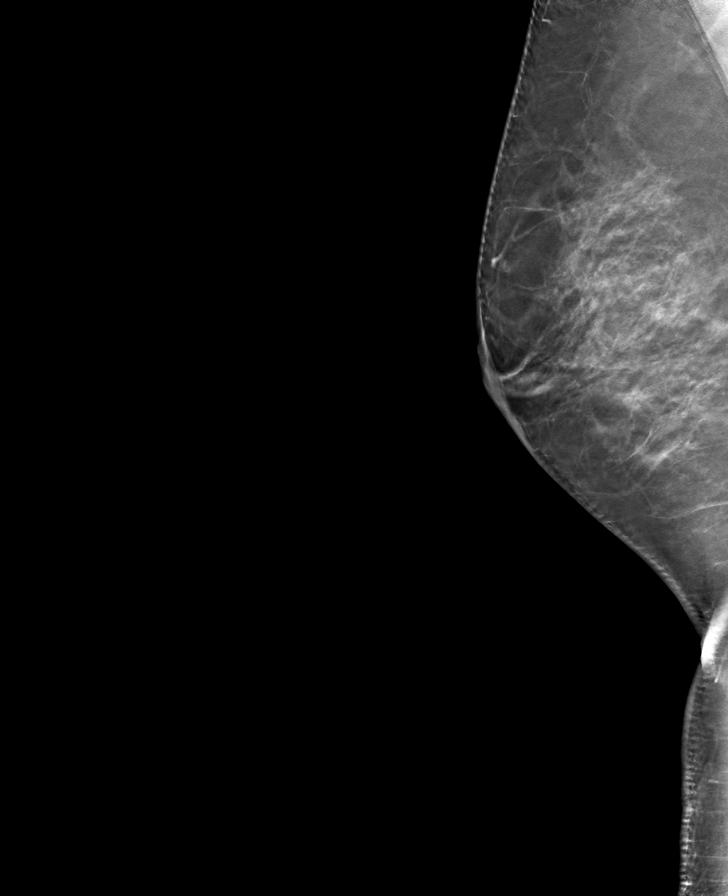

[L ML tomo · tomo slice 34/67.0]
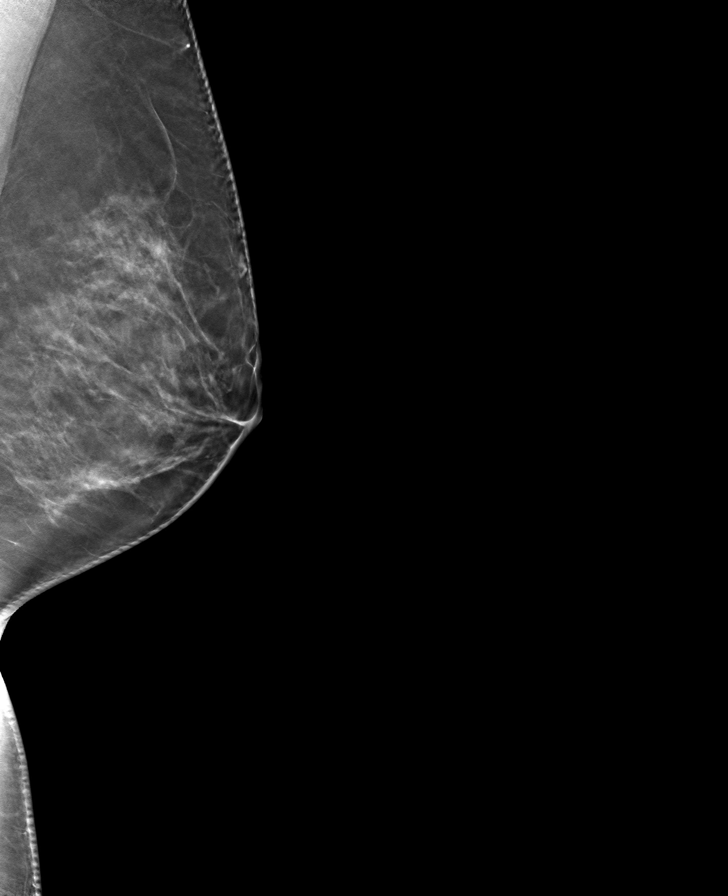

[R MLO tomo · tomo slice 35/69.0]
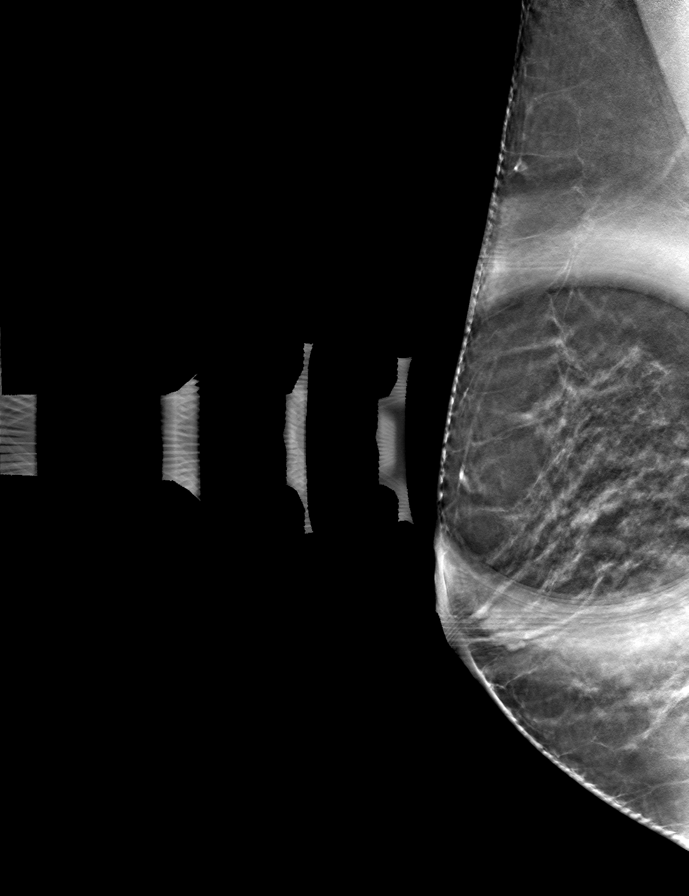

[8 of 24 positions shown; findings below may reference images not displayed]

ACR Breast Density Category b: There are scattered areas of
fibroglandular density.
FINDINGS: Bilateral spot compression tomosynthesis and full field ML views
were performed for the previously questioned abnormalities in the
bilateral breasts. On spot compression views the tissue in these
regions disperses, consistent with overlapping fibroglandular
tissue. No underlying mass or distortion is identified. No
suspicious finding is seen.

Mammographic images were processed with CAD.
IMPRESSION: Resolution of the previously questioned abnormalities in the
bilateral breasts. No mammographic evidence of malignancy.

RECOMMENDATION:
Screening mammogram in one year.(Code:0K-[id number])

I have discussed the findings and recommendations with the patient.
Results were also provided in writing at the conclusion of the
visit. If applicable, a reminder letter will be sent to the patient
regarding the next appointment.

BI-RADS CATEGORY  1: Negative.

## 2020-07-06 ENCOUNTER — Ambulatory Visit
Admission: RE | Admit: 2020-07-06 | Discharge: 2020-07-06 | Disposition: A | Discharge status: Home / Self Care | Payer: Managed Care, Other (non HMO) | Source: Ambulatory Visit | Attending: Obstetrics & Gynecology | Admitting: Obstetrics & Gynecology

## 2020-07-06 ENCOUNTER — Other Ambulatory Visit: Payer: Self-pay

## 2020-07-06 DIAGNOSIS — Z1231 Encounter for screening mammogram for malignant neoplasm of breast: Secondary | ICD-10-CM | POA: Diagnosis not present

## 2020-08-09 ENCOUNTER — Telehealth: Payer: Self-pay

## 2020-08-09 NOTE — Telephone Encounter (Signed)
Pt calling for blood work results to see if she is still fertile or if IUD can come out.  [phone]  [place].- blood work ordered 06/13/20 still active; did she have them drawn? Were they drawn here?

## 2020-08-09 NOTE — Telephone Encounter (Signed)
Pt returned call; states she had labs drawn after appt c Dr. Sabra Heck at [facility] on [address]; [place] to investigate.

## 2020-08-15 NOTE — Telephone Encounter (Signed)
Labs c/w menopause and unlikely fertility. Can schedule IUD removal as desires.

## 2020-08-15 NOTE — Telephone Encounter (Signed)
Pt aware; tx'd to [facility] for scheduling.

## 2020-09-02 ENCOUNTER — Other Ambulatory Visit: Payer: Self-pay

## 2020-09-02 ENCOUNTER — Ambulatory Visit (INDEPENDENT_AMBULATORY_CARE_PROVIDER_SITE_OTHER): Payer: Managed Care, Other (non HMO) | Admitting: Obstetrics & Gynecology

## 2020-09-02 ENCOUNTER — Encounter: Payer: Self-pay | Admitting: Obstetrics & Gynecology

## 2020-09-02 VITALS — BP 130/90 | Ht 67.0 in | Wt 203.0 lb

## 2020-09-02 DIAGNOSIS — Z30432 Encounter for removal of intrauterine contraceptive device: Secondary | ICD-10-CM

## 2020-09-02 NOTE — Progress Notes (Signed)
  History of Present Illness:  Nina Dunn is a 54 y.o. that had a Paragard IUD placed approximately 10 years ago. Since that time, she states that she is now perimenopausal, and she has recent labwork to confirm high FSH and LH levels (thru [facility])..  The following portions of the patient's history were reviewed and updated as appropriate: allergies, current medications, past family history, past medical history, past social history, past surgical history and problem list.  Patient Active Problem List   Diagnosis Date Noted  . Atypical chest pain 01/10/2017  . Tubular adenoma 12/06/2016  . Acute superficial gastritis without hemorrhage 12/06/2016  . Weight gain 07/22/2015  . Constipation 07/22/2015  . Onychomycosis 07/19/2015  . Vitamin B 12 deficiency 07/19/2015  . Vitamin D deficiency 07/19/2015  . Paresthesia 07/19/2015  . Pain in joint of right hip 07/19/2015   Medications:  Current Outpatient Medications on File Prior to Visit  Medication Sig Dispense Refill  . PARAGARD INTRAUTERINE COPPER IUD IUD 1 each by Intrauterine route once. Placed 8/11    . medroxyPROGESTERone (PROVERA) 10 MG tablet Take 10 mg by mouth daily. (Patient not taking: Reported on 09/02/2020)     No current facility-administered medications on file prior to visit.   Allergies: has No Known Allergies.  Physical Exam:  BP 130/90   Ht 5\' 7"  (1.702 m)   Wt 203 lb (92.1 kg)   LMP 08/12/2020   BMI 31.79 kg/m  Body mass index is 31.79 kg/m. Constitutional: Well nourished, well developed female in no acute distress.  Abdomen: diffusely non tender to palpation, non distended, and no masses, hernias Neuro: Grossly intact Psych:  Normal mood and affect.    Pelvic exam:  Two IUD strings present seen coming from the cervical os. EGBUS, vaginal vault and cervix: within normal limits  IUD Removal Strings of IUD identified and grasped.  IUD removed without problem.  Pt tolerated this well.  IUD noted to be  intact.  Assessment: IUD Removal  Plan: IUD removed and plan for contraception is no method needed. She was amenable to this plan.  Barnett Applebaum, M.D. 09/02/2020 9:05 AM

## 2021-06-30 ENCOUNTER — Ambulatory Visit (INDEPENDENT_AMBULATORY_CARE_PROVIDER_SITE_OTHER): Payer: Managed Care, Other (non HMO) | Admitting: Obstetrics & Gynecology

## 2021-06-30 ENCOUNTER — Other Ambulatory Visit: Payer: Self-pay

## 2021-06-30 ENCOUNTER — Encounter: Payer: Self-pay | Admitting: Obstetrics & Gynecology

## 2021-06-30 VITALS — BP 118/80 | Ht 66.5 in | Wt 196.0 lb

## 2021-06-30 DIAGNOSIS — Z780 Asymptomatic menopausal state: Secondary | ICD-10-CM | POA: Diagnosis not present

## 2021-06-30 DIAGNOSIS — Z1231 Encounter for screening mammogram for malignant neoplasm of breast: Secondary | ICD-10-CM

## 2021-06-30 DIAGNOSIS — Z01419 Encounter for gynecological examination (general) (routine) without abnormal findings: Secondary | ICD-10-CM | POA: Diagnosis not present
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
## 2021-06-30 NOTE — Patient Instructions (Signed)
PAP every three years Mammogram every year    Call [phone] to schedule at [facility] Colonoscopy every 10 years Labs yearly (with PCP)  Thank you for choosing [facility]. As part of our ongoing efforts to improve patient experience, we would appreciate your feedback. Please fill out the short survey that you will receive by mail or MyChart. Your opinion is important to [place]! - Dr. Kenton Kingfisher

## 2021-07-25 ENCOUNTER — Ambulatory Visit
Admission: RE | Admit: 2021-07-25 | Discharge: 2021-07-25 | Disposition: A | Discharge status: Home / Self Care | Payer: Managed Care, Other (non HMO) | Source: Ambulatory Visit | Attending: Obstetrics & Gynecology | Admitting: Obstetrics & Gynecology

## 2021-07-25 ENCOUNTER — Other Ambulatory Visit: Payer: Self-pay

## 2021-07-25 DIAGNOSIS — Z1231 Encounter for screening mammogram for malignant neoplasm of breast: Secondary | ICD-10-CM | POA: Insufficient documentation

## 2024-04-15 ENCOUNTER — Other Ambulatory Visit: Payer: Self-pay | Admitting: Internal Medicine

## 2024-04-15 DIAGNOSIS — Z1231 Encounter for screening mammogram for malignant neoplasm of breast: Secondary | ICD-10-CM

## 2024-04-30 ENCOUNTER — Ambulatory Visit
Admission: RE | Admit: 2024-04-30 | Discharge: 2024-04-30 | Disposition: A | Discharge status: Home / Self Care | Source: Ambulatory Visit | Attending: Internal Medicine | Admitting: Internal Medicine

## 2024-04-30 DIAGNOSIS — Z1231 Encounter for screening mammogram for malignant neoplasm of breast: Secondary | ICD-10-CM | POA: Diagnosis present

## 2024-05-06 ENCOUNTER — Other Ambulatory Visit: Payer: Self-pay | Admitting: Internal Medicine

## 2024-05-06 DIAGNOSIS — R928 Other abnormal and inconclusive findings on diagnostic imaging of breast: Secondary | ICD-10-CM

## 2024-05-07 ENCOUNTER — Ambulatory Visit
Admission: RE | Admit: 2024-05-07 | Discharge: 2024-05-07 | Disposition: A | Discharge status: Home / Self Care | Source: Ambulatory Visit | Attending: Internal Medicine | Admitting: Internal Medicine

## 2024-05-07 ENCOUNTER — Inpatient Hospital Stay
Admission: RE | Admit: 2024-05-07 | Discharge: 2024-05-07 | Disposition: A | Discharge status: Home / Self Care | Source: Ambulatory Visit | Attending: Internal Medicine | Admitting: Internal Medicine

## 2024-05-07 ENCOUNTER — Other Ambulatory Visit: Payer: Self-pay | Admitting: Internal Medicine

## 2024-05-07 DIAGNOSIS — R928 Other abnormal and inconclusive findings on diagnostic imaging of breast: Secondary | ICD-10-CM

## 2024-05-13 ENCOUNTER — Ambulatory Visit
Admission: RE | Admit: 2024-05-13 | Discharge: 2024-05-13 | Disposition: A | Discharge status: Home / Self Care | Source: Ambulatory Visit | Attending: Internal Medicine | Admitting: Internal Medicine

## 2024-05-13 DIAGNOSIS — R928 Other abnormal and inconclusive findings on diagnostic imaging of breast: Secondary | ICD-10-CM | POA: Insufficient documentation

## 2024-05-13 DIAGNOSIS — C50211 Malignant neoplasm of upper-inner quadrant of right female breast: Secondary | ICD-10-CM | POA: Insufficient documentation

## 2024-05-13 DIAGNOSIS — R590 Localized enlarged lymph nodes: Secondary | ICD-10-CM | POA: Insufficient documentation

## 2024-05-13 HISTORY — PX: BREAST BIOPSY: SHX20

## 2024-05-13 MED ORDER — LIDOCAINE-EPINEPHRINE 1 %-1:100000 IJ SOLN
5.0000 mL | Freq: Once | INTRAMUSCULAR | Status: DC
  Filled 2024-05-13: qty 5

## 2024-05-13 MED ORDER — CHLOROPROCAINE HCL (PF) 3 % IJ SOLN
5.0000 mL | Freq: Once | INTRAMUSCULAR | Status: AC
  Administered 2024-05-13: 150 mg

## 2024-05-13 MED ORDER — LIDOCAINE 1 % OPTIME INJ - NO CHARGE
2.0000 mL | Freq: Once | INTRAMUSCULAR | Status: DC
  Filled 2024-05-13: qty 2

## 2024-05-13 MED ORDER — LIDOCAINE 1 % OPTIME INJ - NO CHARGE
1.0000 mL | Freq: Once | INTRAMUSCULAR | Status: DC
  Filled 2024-05-13: qty 2

## 2024-05-14 ENCOUNTER — Encounter: Payer: Self-pay | Admitting: *Deleted

## 2024-05-14 DIAGNOSIS — C50911 Malignant neoplasm of unspecified site of right female breast: Secondary | ICD-10-CM

## 2024-05-14 LAB — SURGICAL PATHOLOGY

## 2024-05-14 NOTE — Progress Notes (Signed)
 Received referral for newly diagnosed breast cancer from [facility].  Navigation initiated.  She will see Dr. Melanee on Wed 8/6 at 10:45.  She is going to decide which surgeon she would like to see and let me know either today or tomorrow.

## 2024-05-15 ENCOUNTER — Encounter: Payer: Self-pay | Admitting: *Deleted

## 2024-05-15 NOTE — Progress Notes (Signed)
 Nina Dunn would like to see Dr. Ebbie.   Referral sent to [facility] and they will call her with the appt.

## 2024-05-19 NOTE — H&P (View-Only) (Signed)
 History of Present Illness 58 year old female with newly diagnosed breast cancer who presents for evaluation and discussion of treatment options referred by Dr. Cleotilde.   She initially presented with a screening mammography on April 30, 2024, which revealed a possible mass in the right breast and a possible enlarged right axillary lymph node. A diagnostic mammogram confirmed a five millimeter irregular mass at the two thirty o'clock position of the right breast. Subsequent ultrasound showed at least two prominent right axillary lymph nodes.  A core biopsy was performed, which showed invasive ductal carcinoma, grade two. The lymph node biopsy showed reactive lymph node hyperplasia without malignancy. No prior symptoms such as palpable masses or skin changes were noted before the mammogram.  She has no personal history of breast cancer but reports a family history of breast cancer on her father's side, with two of her father's sisters affected. She had her first menstrual cycle at age 19 and her last menstrual cycle a few years ago. She has had two pregnancies and has used birth control pills and an IUD in the past. She has not undergone hormone therapy or radiation therapy previously.  No prior breast masses, skin changes, or other symptoms before the mammogram.      PAST MEDICAL HISTORY:  Past Medical History:  Diagnosis Date  . B12 deficiency   . Breast cancer (CMS/HHS-HCC) 05/2024  . History of gastritis 05/01/2019  . Hx of adenomatous colonic polyps 05/01/2019        PAST SURGICAL HISTORY:   Past Surgical History:  Procedure Laterality Date  . ANKLE/FOOT SURGERY Bilateral 1994   [place]  . CHOLECYSTECTOMY  2006  . COLONOSCOPY N/A 03/20/2016   Dr. FABIENE Holmes @ [facility] - Adenomatous Polyps, FHPolyps(m): [facility] 03/2019 mailed  . EGD  03/20/2016   Esophagitis: No repeat per RTE  . COLONOSCOPY  06/01/2021   Normal colon biopsy/PHx CP/Repeat 46yrs/CTL         MEDICATIONS:  Outpatient  Encounter Medications as of 05/19/2024  Medication Sig Dispense Refill  . ASHWAGANDHA EXTRACT ORAL Take by mouth once daily    . co-enzyme Q-10, ubiquinone, 100 mg capsule Take 100 mg by mouth once daily    . hydroxychloroquine  (PLAQUENIL ) 200 mg tablet Take 1 tablet (200 mg total) by mouth 2 (two) times daily 180 tablet 1  . Lactobacillus acidophilus (PROBIOTIC ACIDOPHILUS ORAL) Take by mouth once daily    . leflunomide  (ARAVA ) 20 MG tablet Take 1 tablet (20 mg total) by mouth once daily 30 tablet 5  . NON FORMULARY Moringa - daily - amino acid vitamin    . NON FORMULARY Nattokinase --- for cognition    . triamcinolone 0.1 % cream APPLY EXTERNALLY TO THE AFFECTED AREA TWICE DAILY AS NEEDED UNTIL IT GOES AWAY THEN DISCONTINUE    . zinc acetate 50 mg (zinc) Cap Take by mouth once daily     No facility-administered encounter medications on file as of 05/19/2024.     ALLERGIES:   Patient has no known allergies.   SOCIAL HISTORY:  Social History   Socioeconomic History  . Marital status: Married  Tobacco Use  . Smoking status: Never    Passive exposure: Never  . Smokeless tobacco: Never  Vaping Use  . Vaping status: Never Used  Substance and Sexual Activity  . Alcohol use: No  . Drug use: No  . Sexual activity: Yes    Partners: Male  Social History Narrative   Works at [employer].   Social  Drivers of Health   Financial Resource Strain: Low Risk  (05/19/2024)   Overall Financial Resource Strain (CARDIA)   . Difficulty of Paying Living Expenses: Not hard at all  Food Insecurity: No Food Insecurity (05/19/2024)   Hunger Vital Sign   . Worried About Programme researcher, broadcasting/film/video in the Last Year: Never true   . Ran Out of Food in the Last Year: Never true  Transportation Needs: No Transportation Needs (05/19/2024)   PRAPARE - Transportation   . Lack of Transportation (Medical): No   . Lack of Transportation (Non-Medical): No    FAMILY HISTORY:  Family History  Problem Relation Name Age of  Onset  . Colon polyps Mother    . High blood pressure (Hypertension) Father    . No Known Problems Sister    . No Known Problems Sister    . No Known Problems Brother    . No Known Problems Son    . No Known Problems Son    . Breast cancer Paternal Aunt       GENERAL REVIEW OF SYSTEMS:   General ROS: negative for - chills, fatigue, fever, weight gain or weight loss Allergy and Immunology ROS: negative for - hives  Hematological and Lymphatic ROS: negative for - bleeding problems or bruising, negative for palpable nodes Endocrine ROS: negative for - heat or cold intolerance, hair changes Respiratory ROS: negative for - cough, shortness of breath or wheezing Cardiovascular ROS: no chest pain or palpitations GI ROS: negative for nausea, vomiting, abdominal pain, diarrhea, constipation Musculoskeletal ROS: negative for - joint swelling or muscle pain Neurological ROS: negative for - confusion, syncope Dermatological ROS: negative for pruritus and rash  PHYSICAL EXAM:  Vitals:   05/19/24 1109  BP: (!) 151/92  Pulse: 86  .  Ht:165.1 cm (5' 5) Wt:82.6 kg (182 lb) ADJ:Anib surface area is 1.95 meters squared. Body mass index is 30.29 kg/m.SABRA   GENERAL: Alert, active, oriented x3  HEENT: Pupils equal reactive to light. Extraocular movements are intact. Sclera clear. Palpebral conjunctiva normal red color.Pharynx clear.  NECK: Supple with no palpable mass and no adenopathy.  LUNGS: Sound clear with no rales rhonchi or wheezes.  HEART: Regular rhythm S1 and S2 without murmur.  BREAST: Both breasts examined in the supine position.  No palpable masses nipple retraction or nipple discharge.  There is a bruise on the right breast from recent core biopsy.  There is no axillary adenopathy bilaterally.  EXTREMITIES: Well-developed well-nourished symmetrical with no dependent edema.  NEUROLOGICAL: Awake alert oriented, facial expression symmetrical, moving all extremities.    Results RADIOLOGY (I personally evaluated this images) Screening Mammography: Possible mass in right breast and possible enlarged right axillary lymph node (04/30/2024) Diagnostic Mammogram: 5 mm irregular mass at 2:30 position of right breast Ultrasound: At least two prominent right axillary lymph nodes concerning  PATHOLOGY Core Biopsy: Invasive ductal carcinoma, grade 2, no lymphovascular invasion Lymph Node Biopsy: Reactive lymph node hyperplasia    Assessment & Plan Right breast invasive ductal carcinoma, stage I, 5 mm   Invasive ductal carcinoma of the right breast, stage I, with a 5 mm mass at the two thirty o'clock position, was identified via diagnostic mammogram and confirmed by core biopsy. Lymph node biopsy showed reactive lymph node hyperplasia without malignancy. Cancer is stage I, pending sentinel lymph node evaluation and receptor status. She prefers bilateral mastectomy to avoid radiation, despite the tumor's small size and potential for less invasive treatments. She is aware of mastectomy  risks, including infection, bleeding, and delayed recovery, and understands it does not guarantee zero recurrence. Chemotherapy decision will depend on sentinel lymph node biopsy results and receptor status. She understands recurrence chances are slightly lower with mastectomy but not significantly different from lumpectomy with radiation and hormonal therapy, which has a recurrence rate close to 1%. Schedule bilateral mastectomy with sentinel lymph node biopsy. Evaluate sentinel lymph nodes for cancer staging. Await receptor status to guide further treatment options. Discuss potential need for radiation and chemotherapy with oncology. Provide pre-authorization for surgery with insurance. Offer referral to occupational therapy for post-operative care and lymphedema management.   Malignant neoplasm of upper-inner quadrant of right female breast, unspecified estrogen receptor status (CMS/HHS-HCC)  [C50.211]          Patient and her husband verbalized understanding, all questions were answered, and were agreeable with the plan outlined above.   Lucas Sjogren, MD  Electronically signed by Lucas Sjogren, MD

## 2024-05-20 ENCOUNTER — Encounter: Payer: Self-pay | Admitting: Oncology

## 2024-05-20 ENCOUNTER — Encounter: Payer: Self-pay | Admitting: *Deleted

## 2024-05-20 ENCOUNTER — Inpatient Hospital Stay: Attending: Oncology | Admitting: Oncology

## 2024-05-20 ENCOUNTER — Inpatient Hospital Stay: Admitting: Occupational Therapy

## 2024-05-20 ENCOUNTER — Other Ambulatory Visit: Payer: Self-pay | Admitting: General Surgery

## 2024-05-20 ENCOUNTER — Inpatient Hospital Stay

## 2024-05-20 ENCOUNTER — Encounter: Payer: Self-pay | Admitting: Occupational Therapy

## 2024-05-20 VITALS — BP 122/61 | HR 85 | Temp 98.4°F | Resp 18 | Ht 66.5 in | Wt 188.0 lb

## 2024-05-20 DIAGNOSIS — Z8249 Family history of ischemic heart disease and other diseases of the circulatory system: Secondary | ICD-10-CM | POA: Insufficient documentation

## 2024-05-20 DIAGNOSIS — M3313 Other dermatomyositis without myopathy: Secondary | ICD-10-CM | POA: Insufficient documentation

## 2024-05-20 DIAGNOSIS — R293 Abnormal posture: Secondary | ICD-10-CM

## 2024-05-20 DIAGNOSIS — C50211 Malignant neoplasm of upper-inner quadrant of right female breast: Secondary | ICD-10-CM

## 2024-05-20 DIAGNOSIS — Z808 Family history of malignant neoplasm of other organs or systems: Secondary | ICD-10-CM | POA: Insufficient documentation

## 2024-05-20 DIAGNOSIS — Z7189 Other specified counseling: Secondary | ICD-10-CM

## 2024-05-20 DIAGNOSIS — Z860101 Personal history of adenomatous and serrated colon polyps: Secondary | ICD-10-CM | POA: Diagnosis not present

## 2024-05-20 DIAGNOSIS — Z9013 Acquired absence of bilateral breasts and nipples: Secondary | ICD-10-CM | POA: Insufficient documentation

## 2024-05-20 DIAGNOSIS — M069 Rheumatoid arthritis, unspecified: Secondary | ICD-10-CM | POA: Insufficient documentation

## 2024-05-20 DIAGNOSIS — Z170 Estrogen receptor positive status [ER+]: Secondary | ICD-10-CM | POA: Insufficient documentation

## 2024-05-20 DIAGNOSIS — Z1721 Progesterone receptor positive status: Secondary | ICD-10-CM | POA: Insufficient documentation

## 2024-05-20 DIAGNOSIS — Z803 Family history of malignant neoplasm of breast: Secondary | ICD-10-CM | POA: Diagnosis not present

## 2024-05-20 DIAGNOSIS — C50911 Malignant neoplasm of unspecified site of right female breast: Secondary | ICD-10-CM

## 2024-05-20 DIAGNOSIS — Z79899 Other long term (current) drug therapy: Secondary | ICD-10-CM | POA: Diagnosis not present

## 2024-05-20 DIAGNOSIS — Z9049 Acquired absence of other specified parts of digestive tract: Secondary | ICD-10-CM | POA: Diagnosis not present

## 2024-05-20 DIAGNOSIS — Z1732 Human epidermal growth factor receptor 2 negative status: Secondary | ICD-10-CM | POA: Diagnosis not present

## 2024-05-20 DIAGNOSIS — Z7969 Long term (current) use of other immunomodulators and immunosuppressants: Secondary | ICD-10-CM | POA: Insufficient documentation

## 2024-05-20 LAB — GENETIC SCREENING ORDER
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
## 2024-05-20 NOTE — Progress Notes (Signed)
 Patient is a new patient here for Invasive ductal carcinoma of breast, female, right (HCC).

## 2024-05-20 NOTE — Progress Notes (Signed)
 Accompanied patient and family to initial medical oncology appointment.   Reviewed Breast Cancer treatment handbook.   Care plan summary given to patient.   Reviewed outreach programs and cancer center services.

## 2024-05-21 ENCOUNTER — Encounter: Payer: Self-pay | Admitting: *Deleted

## 2024-05-21 NOTE — Progress Notes (Signed)
 Called Ms. Van Zyl with hormone receptor results.

## 2024-05-22 ENCOUNTER — Ambulatory Visit: Payer: Self-pay | Admitting: General Surgery

## 2024-05-24 ENCOUNTER — Encounter: Payer: Self-pay | Admitting: Oncology

## 2024-05-24 DIAGNOSIS — Z170 Estrogen receptor positive status [ER+]: Secondary | ICD-10-CM | POA: Insufficient documentation

## 2024-05-24 NOTE — Progress Notes (Signed)
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 05/24/2024 Histologic grading system: 3 grade system     Thank you for this kind referral and the opportunity to participate in the care of this  Patient   Visit Diagnosis 1. Invasive ductal carcinoma of breast, female, right [facility])     Dr. Annah Melanee, MD, MPH [facility] at [facility] [phone] 05/24/2024

## 2024-05-25 ENCOUNTER — Other Ambulatory Visit: Payer: Self-pay

## 2024-05-25 ENCOUNTER — Encounter
Admission: RE | Admit: 2024-05-25 | Discharge: 2024-05-25 | Disposition: A | Discharge status: Home / Self Care | Source: Ambulatory Visit | Attending: General Surgery | Admitting: General Surgery
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
## 2024-05-25 NOTE — Patient Instructions (Addendum)
 Your procedure is scheduled on:  Wednesday 05/27/24 Report to the Registration Desk on the 1st floor of the [facility] at 7:30 as scheduled  REMEMBER: Instructions that are not followed completely may result in serious medical risk, up to and including death; or upon the discretion of your surgeon and anesthesiologist your surgery may need to be rescheduled.  Do not eat food or drink any liquids after midnight the night before surgery.  No gum chewing or hard candies.  One week prior to surgery: Stop Anti-inflammatories (NSAIDS) such as Advil, Aleve, Ibuprofen, Motrin, Naproxen, Naprosyn and Aspirin based products such as Excedrin, Goody's Powder, BC Powder.  You may however, continue to take Tylenol  if needed for pain up until the day of surgery.  Stop ALL OVER THE COUNTER supplements and vitamins until after surgery.  ON THE DAY OF SURGERY ONLY TAKE THESE MEDICATIONS WITH SIPS OF WATER :  none  No Alcohol for 24 hours before or after surgery.  No Smoking including e-cigarettes for 24 hours before surgery.  No chewable tobacco products for at least 6 hours before surgery.  No nicotine patches on the day of surgery.  Do not use any recreational drugs for at least a week (preferably 2 weeks) before your surgery.  Please be advised that the combination of cocaine and anesthesia may have negative outcomes, up to and including death. If you test positive for cocaine, your surgery will be cancelled.  On the morning of surgery brush your teeth with toothpaste and water , you may rinse your mouth with mouthwash if you wish. Do not swallow any toothpaste or mouthwash.  Use CHG Soap or wipes as directed on instruction sheet.  Do not wear lotions, powders, deodorants or perfumes.  Do not shave body hair from the neck down 48 hours before surgery.  Wear comfortable clothing (specific to your surgery type) to the hospital.  Do not wear jewelry, make-up, hairpins, clips or nail  polish.  For welded (permanent) jewelry: bracelets, anklets, waist bands, etc.  Please have this removed prior to surgery.  If it is not removed, there is a chance that hospital personnel will need to cut it off on the day of surgery.   Contact lenses, hearing aids and dentures may not be worn into surgery.  Do not bring valuables to the hospital. [facility] is not responsible for any missing/lost belongings or valuables.   Notify your doctor if there is any change in your medical condition (cold, fever, infection).  After surgery, you can help prevent lung complications by doing breathing exercises.  Take deep breaths and cough every 1-2 hours. Your doctor may order a device called an Incentive Spirometer to help you take deep breaths. When coughing or sneezing, hold a pillow firmly against your incision with both hands. This is called "splinting." Doing this helps protect your incision. It also decreases discomfort.  If you are being admitted to the hospital overnight, leave your suitcase in the car. After surgery it may be brought to your room.  In case of increased patient census, it may be necessary for you, the patient, to continue your postoperative care in the Same Day Surgery department.  Please call the Pre-admissions Testing Dept. at [phone] if you have any questions about these instructions.  Surgery Visitation Policy:  Patients having surgery or a procedure may have two visitors.  Children under the age of 66 must have an adult with them who is not the patient.  Inpatient Visitation:  Visiting hours are 7 a.m. to 8 p.m. Up to four visitors are allowed at one time in a patient room. The visitors may rotate out with other people during the day.  One visitor age 55 or older may stay with the patient overnight and must be in the room by 8 p.m.   Merchandiser, retail to address health-related social needs:  [email or web address]     Preparing  for Surgery with CHLORHEXIDINE  GLUCONATE (CHG) Soap  Chlorhexidine  Gluconate (CHG) Soap  o An antiseptic cleaner that kills germs and bonds with the skin to continue killing germs even after washing  o Used for showering the night before surgery and morning of surgery  Before surgery, you can play an important role by reducing the number of germs on your skin.  CHG (Chlorhexidine  gluconate) soap is an antiseptic cleanser which kills germs and bonds with the skin to continue killing germs even after washing.  Please do not use if you have an allergy to CHG or antibacterial soaps. If your skin becomes reddened/irritated stop using the CHG.  1. Shower the NIGHT BEFORE SURGERY and the MORNING OF SURGERY with CHG soap.  2. If you choose to wash your hair, wash your hair first as usual with your normal shampoo.  3. After shampooing, rinse your hair and body thoroughly to remove the shampoo.  4. Use CHG as you would any other liquid soap. You can apply CHG directly to the skin and wash gently with a scrungie or a clean washcloth.  5. Apply the CHG soap to your body only from the neck down. Do not use on open wounds or open sores. Avoid contact with your eyes, ears, mouth, and genitals (private parts). Wash face and genitals (private parts) with your normal soap.  6. Wash thoroughly, paying special attention to the area where your surgery will be performed.  7. Thoroughly rinse your body with warm water .  8. Do not shower/wash with your normal soap after using and rinsing off the CHG soap.  9. Pat yourself dry with a clean towel.  10. Wear clean pajamas to bed the night before surgery.  12. Place clean sheets on your bed the night of your first shower and do not sleep with pets.  13. Shower again with the CHG soap on the day of surgery prior to arriving at the hospital.  14. Do not apply any deodorants/lotions/powders.  15. Please wear clean clothes to the hospital.  How to Use an  Incentive Spirometer  An incentive spirometer is a tool that measures how well you are filling your lungs with each breath. Learning to take long, deep breaths using this tool can help you keep your lungs clear and active. This may help to reverse or lessen your chance of developing breathing (pulmonary) problems, especially infection. You may be asked to use a spirometer: After a surgery. If you have a lung problem or a history of smoking. After a long period of time when you have been unable to move or be active. If the spirometer includes an indicator to show the highest number that you have reached, your health care provider or respiratory therapist will help you set a goal. Keep a log of your progress as told by your health care provider. What are the risks? Breathing too quickly may cause dizziness or cause you to pass out. Take your time so you do not get dizzy or light-headed. If you are in pain, you may need to take pain medicine  before doing incentive spirometry. It is harder to take a deep breath if you are having pain. How to use your incentive spirometer  Sit up on the edge of your bed or on a chair. Hold the incentive spirometer so that it is in an upright position. Before you use the spirometer, breathe out normally. Place the mouthpiece in your mouth. Make sure your lips are closed tightly around it. Breathe in slowly and as deeply as you can through your mouth, causing the piston or the ball to rise toward the top of the chamber. Hold your breath for 3-5 seconds, or for as long as possible. If the spirometer includes a coach indicator, use this to guide you in breathing. Slow down your breathing if the indicator goes above the marked areas. Remove the mouthpiece from your mouth and breathe out normally. The piston or ball will return to the bottom of the chamber. Rest for a few seconds, then repeat the steps 10 or more times. Take your time and take a few normal breaths between  deep breaths so that you do not get dizzy or light-headed. Do this every 1-2 hours when you are awake. If the spirometer includes a goal marker to show the highest number you have reached (best effort), use this as a goal to work toward during each repetition. After each set of 10 deep breaths, cough a few times. This will help to make sure that your lungs are clear. If you have an incision on your chest or abdomen from surgery, place a pillow or a rolled-up towel firmly against the incision when you cough. This can help to reduce pain while taking deep breaths and coughing. General tips When you are able to get out of bed: Walk around often. Continue to take deep breaths and cough in order to clear your lungs. Keep using the incentive spirometer until your health care provider says it is okay to stop using it. If you have been in the hospital, you may be told to keep using the spirometer at home. Contact a health care provider if: You are having difficulty using the spirometer. You have trouble using the spirometer as often as instructed. Your pain medicine is not giving enough relief for you to use the spirometer as told. You have a fever. Get help right away if: You develop shortness of breath. You develop a cough with bloody mucus from the lungs. You have fluid or blood coming from an incision site after you cough. Summary An incentive spirometer is a tool that can help you learn to take long, deep breaths to keep your lungs clear and active. You may be asked to use a spirometer after a surgery, if you have a lung problem or a history of smoking, or if you have been inactive for a long period of time. Use your incentive spirometer as instructed every 1-2 hours while you are awake. If you have an incision on your chest or abdomen, place a pillow or a rolled-up towel firmly against your incision when you cough. This will help to reduce pain. Get help right away if you have shortness of  breath, you cough up bloody mucus, or blood comes from your incision when you cough. This information is not intended to replace advice given to you by your health care provider. Make sure you discuss any questions you have with your health care provider. Document Revised: 12/21/2019 Document Reviewed: 12/21/2019 Elsevier Patient Education  2023 [employer].

## 2024-05-26 MED ORDER — LACTATED RINGERS IV SOLN: INTRAVENOUS | Status: DC

## 2024-05-26 MED ORDER — ORAL CARE MOUTH RINSE: 15.0000 mL | Freq: Once | OROMUCOSAL | Status: AC

## 2024-05-26 MED ORDER — CHLORHEXIDINE GLUCONATE 0.12 % MT SOLN
15.0000 mL | Freq: Once | OROMUCOSAL | Status: AC
  Administered 2024-05-27 (×2): 15 mL via OROMUCOSAL

## 2024-05-26 MED ORDER — CEFAZOLIN SODIUM-DEXTROSE 2-4 GM/100ML-% IV SOLN
2.0000 g | INTRAVENOUS | Status: AC
  Administered 2024-05-27 (×2): 2 g via INTRAVENOUS

## 2024-05-27 ENCOUNTER — Encounter: Payer: Self-pay | Admitting: General Surgery

## 2024-05-27 ENCOUNTER — Other Ambulatory Visit: Payer: Self-pay

## 2024-05-27 ENCOUNTER — Observation Stay
Admission: RE | Admit: 2024-05-27 | Discharge: 2024-05-27 | Disposition: A | Discharge status: Home / Self Care | Source: Ambulatory Visit | Attending: General Surgery | Admitting: General Surgery

## 2024-05-27 ENCOUNTER — Observation Stay
Admission: RE | Admit: 2024-05-27 | Discharge: 2024-05-28 | Disposition: A | Discharge status: Home / Self Care | Attending: General Surgery | Admitting: General Surgery

## 2024-05-27 ENCOUNTER — Ambulatory Visit: Admitting: Certified Registered"

## 2024-05-27 ENCOUNTER — Ambulatory Visit: Payer: Self-pay | Admitting: Urgent Care

## 2024-05-27 ENCOUNTER — Encounter
Admission: RE | Disposition: A | Discharge status: Home / Self Care | Payer: Self-pay | Source: Home / Self Care | Attending: General Surgery

## 2024-05-27 DIAGNOSIS — C50211 Malignant neoplasm of upper-inner quadrant of right female breast: Secondary | ICD-10-CM

## 2024-05-27 DIAGNOSIS — C50911 Malignant neoplasm of unspecified site of right female breast: Secondary | ICD-10-CM | POA: Diagnosis present

## 2024-05-27 DIAGNOSIS — I890 Lymphedema, not elsewhere classified: Secondary | ICD-10-CM | POA: Insufficient documentation

## 2024-05-27 DIAGNOSIS — C50919 Malignant neoplasm of unspecified site of unspecified female breast: Principal | ICD-10-CM | POA: Diagnosis present

## 2024-05-27 HISTORY — PX: SENTINEL NODE BIOPSY: SHX6608

## 2024-05-27 HISTORY — PX: TOTAL MASTECTOMY: SHX6129

## 2024-05-27 SURGERY — MASTECTOMY, SIMPLE
Anesthesia: General | Site: Breast | Laterality: Right

## 2024-05-27 MED ORDER — DROPERIDOL 2.5 MG/ML IJ SOLN: 0.6250 mg | Freq: Once | INTRAMUSCULAR | Status: DC | PRN

## 2024-05-27 MED ORDER — BUPIVACAINE-EPINEPHRINE (PF) 0.5% -1:200000 IJ SOLN
INTRAMUSCULAR | Status: AC
  Filled 2024-05-27: qty 30

## 2024-05-27 MED ORDER — FENTANYL CITRATE (PF) 100 MCG/2ML IJ SOLN
INTRAMUSCULAR | Status: AC
  Filled 2024-05-27: qty 2

## 2024-05-27 MED ORDER — SODIUM CHLORIDE (PF) 0.9 % IJ SOLN
INTRAMUSCULAR | Status: AC
Start: 2024-05-27 — End: 2024-05-27
  Filled 2024-05-27: qty 50

## 2024-05-27 MED ORDER — DEXMEDETOMIDINE HCL IN NACL 80 MCG/20ML IV SOLN
INTRAVENOUS | Status: AC
  Filled 2024-05-27: qty 20

## 2024-05-27 MED ORDER — ONDANSETRON 4 MG PO TBDP: 4.0000 mg | ORAL_TABLET | Freq: Four times a day (QID) | ORAL | Status: DC | PRN

## 2024-05-27 MED ORDER — ONDANSETRON HCL 4 MG/2ML IJ SOLN
INTRAMUSCULAR | Status: DC | PRN
  Administered 2024-05-27 (×2): 4 mg via INTRAVENOUS

## 2024-05-27 MED ORDER — METHYLENE BLUE (ANTIDOTE) 1 % IV SOLN
INTRAVENOUS | Status: AC
  Filled 2024-05-27: qty 10

## 2024-05-27 MED ORDER — CEFAZOLIN SODIUM-DEXTROSE 2-4 GM/100ML-% IV SOLN
INTRAVENOUS | Status: AC
  Filled 2024-05-27: qty 100

## 2024-05-27 MED ORDER — ACETAMINOPHEN 500 MG PO TABS
1000.0000 mg | ORAL_TABLET | Freq: Once | ORAL | Status: AC
  Administered 2024-05-27 (×2): 1000 mg via ORAL

## 2024-05-27 MED ORDER — ACETAMINOPHEN 10 MG/ML IV SOLN: 1000.0000 mg | Freq: Once | INTRAVENOUS | Status: DC | PRN

## 2024-05-27 MED ORDER — ENOXAPARIN SODIUM 40 MG/0.4ML IJ SOSY
40.0000 mg | PREFILLED_SYRINGE | INTRAMUSCULAR | Status: DC
  Administered 2024-05-28: 40 mg via SUBCUTANEOUS
  Filled 2024-05-27: qty 0.4

## 2024-05-27 MED ORDER — SODIUM CHLORIDE (PF) 0.9 % IJ SOLN
INTRAMUSCULAR | Status: DC | PRN
  Administered 2024-05-27 (×4): 50 mL

## 2024-05-27 MED ORDER — LEFLUNOMIDE 10 MG PO TABS
20.0000 mg | ORAL_TABLET | Freq: Every day | ORAL | Status: DC
  Administered 2024-05-28: 20 mg via ORAL
  Filled 2024-05-27: qty 2

## 2024-05-27 MED ORDER — DEXAMETHASONE SODIUM PHOSPHATE 10 MG/ML IJ SOLN
INTRAMUSCULAR | Status: AC
  Filled 2024-05-27: qty 1

## 2024-05-27 MED ORDER — LIDOCAINE HCL (CARDIAC) PF 100 MG/5ML IV SOSY
PREFILLED_SYRINGE | INTRAVENOUS | Status: DC | PRN
  Administered 2024-05-27 (×2): 100 mg via INTRAVENOUS

## 2024-05-27 MED ORDER — CELECOXIB 200 MG PO CAPS
ORAL_CAPSULE | ORAL | Status: AC
  Filled 2024-05-27: qty 1

## 2024-05-27 MED ORDER — BUPIVACAINE LIPOSOME 1.3 % IJ SUSP
INTRAMUSCULAR | Status: AC
Start: 2024-05-27 — End: 2024-05-27
  Filled 2024-05-27: qty 20

## 2024-05-27 MED ORDER — PROPOFOL 10 MG/ML IV BOLUS
INTRAVENOUS | Status: AC
  Filled 2024-05-27: qty 20

## 2024-05-27 MED ORDER — KETAMINE HCL 50 MG/5ML IJ SOSY
PREFILLED_SYRINGE | INTRAMUSCULAR | Status: AC
  Filled 2024-05-27: qty 5

## 2024-05-27 MED ORDER — OXYCODONE HCL 5 MG PO TABS
5.0000 mg | ORAL_TABLET | Freq: Once | ORAL | Status: AC | PRN
  Administered 2024-05-27 (×2): 5 mg via ORAL

## 2024-05-27 MED ORDER — PROPOFOL 10 MG/ML IV BOLUS
INTRAVENOUS | Status: DC | PRN
  Administered 2024-05-27 (×2): 200 mg via INTRAVENOUS

## 2024-05-27 MED ORDER — FENTANYL CITRATE (PF) 100 MCG/2ML IJ SOLN
INTRAMUSCULAR | Status: DC | PRN
  Administered 2024-05-27 (×4): 50 ug via INTRAVENOUS

## 2024-05-27 MED ORDER — GLYCOPYRROLATE 0.2 MG/ML IJ SOLN
INTRAMUSCULAR | Status: DC | PRN
  Administered 2024-05-27 (×4): .1 mg via INTRAVENOUS

## 2024-05-27 MED ORDER — HYDROCODONE-ACETAMINOPHEN 5-325 MG PO TABS
1.0000 | ORAL_TABLET | ORAL | Status: DC | PRN
  Administered 2024-05-27 – 2024-05-28 (×3): 1 via ORAL
  Filled 2024-05-27 (×2): qty 1

## 2024-05-27 MED ORDER — ACETAMINOPHEN 500 MG PO TABS
ORAL_TABLET | ORAL | Status: AC
Start: 2024-05-27 — End: 2024-05-27
  Filled 2024-05-27: qty 2

## 2024-05-27 MED ORDER — OXYCODONE HCL 5 MG PO TABS
ORAL_TABLET | ORAL | Status: AC
  Filled 2024-05-27: qty 1

## 2024-05-27 MED ORDER — HYDROMORPHONE HCL 1 MG/ML IJ SOLN
INTRAMUSCULAR | Status: DC | PRN
  Administered 2024-05-27 (×2): .5 mg via INTRAVENOUS

## 2024-05-27 MED ORDER — DEXMEDETOMIDINE HCL IN NACL 80 MCG/20ML IV SOLN
INTRAVENOUS | Status: DC | PRN
  Administered 2024-05-27 (×8): 4 ug via INTRAVENOUS

## 2024-05-27 MED ORDER — MORPHINE SULFATE (PF) 4 MG/ML IV SOLN: 4.0000 mg | INTRAVENOUS | Status: DC | PRN

## 2024-05-27 MED ORDER — GABAPENTIN 300 MG PO CAPS
ORAL_CAPSULE | ORAL | Status: AC
  Filled 2024-05-27: qty 1

## 2024-05-27 MED ORDER — MIDAZOLAM HCL 2 MG/2ML IJ SOLN
INTRAMUSCULAR | Status: AC
  Filled 2024-05-27: qty 2

## 2024-05-27 MED ORDER — FENTANYL CITRATE (PF) 100 MCG/2ML IJ SOLN
25.0000 ug | INTRAMUSCULAR | Status: AC | PRN
  Administered 2024-05-27: 25 ug via INTRAVENOUS
  Administered 2024-05-27 (×2): 50 ug via INTRAVENOUS
  Administered 2024-05-27: 25 ug via INTRAVENOUS
  Administered 2024-05-27: 50 ug via INTRAVENOUS
  Administered 2024-05-27 (×6): 25 ug via INTRAVENOUS
  Administered 2024-05-27: 50 ug via INTRAVENOUS

## 2024-05-27 MED ORDER — TECHNETIUM TC 99M TILMANOCEPT KIT
1.0600 | PACK | Freq: Once | INTRAVENOUS | Status: AC | PRN
  Administered 2024-05-27 (×2): 1.06 via INTRADERMAL

## 2024-05-27 MED ORDER — ONDANSETRON HCL 4 MG/2ML IJ SOLN
INTRAMUSCULAR | Status: AC
  Filled 2024-05-27: qty 2

## 2024-05-27 MED ORDER — STERILE WATER FOR IRRIGATION IR SOLN
Status: DC | PRN
  Administered 2024-05-27 (×2): 1000 mL

## 2024-05-27 MED ORDER — LIDOCAINE HCL (PF) 2 % IJ SOLN
INTRAMUSCULAR | Status: AC
  Filled 2024-05-27: qty 5

## 2024-05-27 MED ORDER — CELECOXIB 200 MG PO CAPS
200.0000 mg | ORAL_CAPSULE | Freq: Once | ORAL | Status: AC
  Administered 2024-05-27 (×2): 200 mg via ORAL

## 2024-05-27 MED ORDER — DEXAMETHASONE SODIUM PHOSPHATE 10 MG/ML IJ SOLN
INTRAMUSCULAR | Status: DC | PRN
  Administered 2024-05-27 (×2): 10 mg via INTRAVENOUS

## 2024-05-27 MED ORDER — OXYCODONE HCL 5 MG/5ML PO SOLN: 5.0000 mg | Freq: Once | ORAL | Status: AC | PRN

## 2024-05-27 MED ORDER — GABAPENTIN 300 MG PO CAPS
300.0000 mg | ORAL_CAPSULE | Freq: Once | ORAL | Status: AC
  Administered 2024-05-27 (×2): 300 mg via ORAL

## 2024-05-27 MED ORDER — HYDROXYCHLOROQUINE SULFATE 200 MG PO TABS
200.0000 mg | ORAL_TABLET | Freq: Two times a day (BID) | ORAL | Status: DC
  Administered 2024-05-28: 200 mg via ORAL
  Filled 2024-05-27: qty 1

## 2024-05-27 MED ORDER — CHLORHEXIDINE GLUCONATE 0.12 % MT SOLN
OROMUCOSAL | Status: AC
  Filled 2024-05-27: qty 15

## 2024-05-27 MED ORDER — ACETAMINOPHEN 325 MG PO TABS: 650.0000 mg | ORAL_TABLET | Freq: Four times a day (QID) | ORAL | Status: DC | PRN

## 2024-05-27 MED ORDER — ONDANSETRON HCL 4 MG/2ML IJ SOLN: 4.0000 mg | Freq: Four times a day (QID) | INTRAMUSCULAR | Status: DC | PRN

## 2024-05-27 MED ORDER — GLYCOPYRROLATE 0.2 MG/ML IJ SOLN
INTRAMUSCULAR | Status: AC
  Filled 2024-05-27: qty 1

## 2024-05-27 MED ORDER — EPHEDRINE 5 MG/ML INJ
INTRAVENOUS | Status: AC
  Filled 2024-05-27: qty 5

## 2024-05-27 MED ORDER — MIDAZOLAM HCL 2 MG/2ML IJ SOLN
INTRAMUSCULAR | Status: DC | PRN
  Administered 2024-05-27 (×2): 2 mg via INTRAVENOUS

## 2024-05-27 MED ORDER — EPHEDRINE SULFATE-NACL 50-0.9 MG/10ML-% IV SOSY
PREFILLED_SYRINGE | INTRAVENOUS | Status: DC | PRN
  Administered 2024-05-27 (×6): 5 mg via INTRAVENOUS

## 2024-05-27 MED ORDER — HYDROMORPHONE HCL 1 MG/ML IJ SOLN
INTRAMUSCULAR | Status: AC
  Filled 2024-05-27: qty 1

## 2024-05-27 MED ORDER — KETAMINE HCL 50 MG/5ML IJ SOSY
PREFILLED_SYRINGE | INTRAMUSCULAR | Status: DC | PRN
  Administered 2024-05-27 (×2): 20 mg via INTRAVENOUS
  Administered 2024-05-27 (×2): 10 mg via INTRAVENOUS

## 2024-05-27 SURGICAL SUPPLY — 44 items
BINDER BREAST XLRG (GAUZE/BANDAGES/DRESSINGS) IMPLANT
BLADE SURG 15 STRL LF DISP TIS (BLADE) ×2 IMPLANT
CHLORAPREP W/TINT 26 (MISCELLANEOUS) ×2 IMPLANT
CLIP APPLIE 11 MED OPEN (CLIP) IMPLANT
CLIP APPLIE 9.375 SM OPEN (CLIP) IMPLANT
CNTNR URN SCR LID CUP LEK RST (MISCELLANEOUS) ×2 IMPLANT
COVER PROBE GAMMA FINDER SLV (MISCELLANEOUS) ×2 IMPLANT
DERMABOND ADVANCED .7 DNX12 (GAUZE/BANDAGES/DRESSINGS) ×4 IMPLANT
DEVICE DUBIN SPECIMEN MAMMOGRA (MISCELLANEOUS) ×2 IMPLANT
DRAIN CHANNEL JP 15F RND 3/16 (MISCELLANEOUS) IMPLANT
DRAIN CHANNEL JP 19F RND 3/16 (MISCELLANEOUS) IMPLANT
DRAPE LAPAROTOMY TRNSV 106X77 (MISCELLANEOUS) ×2 IMPLANT
DRSG GAUZE FLUFF 36X18 (GAUZE/BANDAGES/DRESSINGS) ×2 IMPLANT
ELECTRODE BLDE 4.0 EZ CLN MEGD (MISCELLANEOUS) IMPLANT
ELECTRODE REM PT RTRN 9FT ADLT (ELECTROSURGICAL) ×2 IMPLANT
EVACUATOR DRAINAGE 10X20 100CC (DRAIN) ×2 IMPLANT
EVACUATOR SILICONE 100CC (DRAIN) IMPLANT
GAUZE 4X4 16PLY ~~LOC~~+RFID DBL (SPONGE) ×2 IMPLANT
GAUZE SPONGE 4X4 12PLY STRL (GAUZE/BANDAGES/DRESSINGS) ×2 IMPLANT
GLOVE BIO SURGEON STRL SZ 6.5 (GLOVE) ×4 IMPLANT
GLOVE BIOGEL PI IND STRL 6.5 (GLOVE) ×2 IMPLANT
GLOVE SURG SYN 6.5 PF PI (GLOVE) ×4 IMPLANT
GOWN STRL REUS W/ TWL LRG LVL3 (GOWN DISPOSABLE) ×6 IMPLANT
KIT TURNOVER KIT A (KITS) ×2 IMPLANT
LABEL OR SOLS (LABEL) ×2 IMPLANT
MANIFOLD NEPTUNE II (INSTRUMENTS) ×2 IMPLANT
NDL HYPO 22X1.5 SAFETY MO (MISCELLANEOUS) ×2 IMPLANT
NDL SAFETY ECLIPSE 18X1.5 (NEEDLE) IMPLANT
NEEDLE HYPO 22X1.5 SAFETY MO (MISCELLANEOUS) ×2 IMPLANT
PACK BASIN MAJOR ARMC (MISCELLANEOUS) ×2 IMPLANT
PACK UNIVERSAL (MISCELLANEOUS) IMPLANT
SPONGE T-LAP 18X18 ~~LOC~~+RFID (SPONGE) ×4 IMPLANT
SUT SILK 2 0 SH (SUTURE) ×2 IMPLANT
SUT SILK 3 0 SH CR/8 (SUTURE) ×2 IMPLANT
SUT VIC AB 3-0 54X BRD REEL (SUTURE) ×2 IMPLANT
SUT VIC AB 3-0 SH 27X BRD (SUTURE) IMPLANT
SUT VIC AB 3-0 SH 8-18 (SUTURE) ×2 IMPLANT
SUTURE EHLN 3-0 FS-10 30 BLK (SUTURE) ×2 IMPLANT
SUTURE MNCRL 4-0 27XMF (SUTURE) ×4 IMPLANT
SYR 10ML LL (SYRINGE) IMPLANT
SYR BULB IRRIG 60ML STRL (SYRINGE) ×2 IMPLANT
TRAP FLUID SMOKE EVACUATOR (MISCELLANEOUS) ×2 IMPLANT
WATER STERILE IRR 1000ML POUR (IV SOLUTION) ×2 IMPLANT
WATER STERILE IRR 500ML POUR (IV SOLUTION) ×2 IMPLANT

## 2024-05-27 NOTE — Op Note (Signed)
 Preoperative diagnosis: Invasive Carcinoma of the right breast.  Postoperative diagnosis: Same  Procedure: Bilateral total mastectomies                     Right axillary sentinel lymph node biopsy.   Anesthesia: GETA  Surgeon: Dr. Cesar Coe  Wound Classification: Clean  Indications: Patient is a 58 y.o. female who had an abnormal mammogram that on workup with core needle biopsy was found to be invasive mammary carcinoma. After discussion of alternatives, the patient elected bilateral total mastectomy.  Findings: No abnormal palpable mass or lymph node  Description of procedure: The patient was brought to the operating room and general anesthesia was induced. A time-out was completed verifying correct patient, procedure, site, positioning, and implant(s) and/or special equipment prior to beginning this procedure. The breast, chest wall, axilla, and upper arm and neck were prepped and draped in the usual sterile fashion.  A skin incision was made that encompassed the nipple-areola complex of the right breast. Flaps were raised in the avascular plane between subcutaneous tissue and breast tissue from the clavicle superiorly, the sternum medially, the anterior rectus sheath inferiorly, and past the lateral border of the pectoralis major muscle laterally. Hemostasis was achieved in the flaps. Next, the breast tissue and underlying pectoralis fascia were excised from the pectoralis major muscle, progressing from medially to laterally. At the lateral border of the pectoralis major muscle, the breast tissue was swung laterally and a lateral pedicle identified where breast tissue gave way to fat of axilla. The lateral pedicle was incised and the specimen removed.  A hand-held gamma probe was used to identify the location of the hottest spot in the axilla. Dissection was carried down until subdermal facias was advanced. The probe was placed and again, the point of maximal count was found. Dissection  continue until nodule was identified. The probe was placed in contact with the node. The node was excised in its entirety.  An additional hot spot was detected and the node was excised in similar fashion. No additional hot spots were identified. No clinically abnormal nodes were palpated.  Then, on the left breast, a skin incision was made that encompassed the nipple-areola complex of the right breast. Flaps were raised in the avascular plane between subcutaneous tissue and breast tissue from the clavicle superiorly, the sternum medially, the anterior rectus sheath inferiorly, and past the lateral border of the pectoralis major muscle laterally. Hemostasis was achieved in the flaps. Next, the breast tissue and underlying pectoralis fascia were excised from the pectoralis major muscle, progressing from medially to laterally. At the lateral border of the pectoralis major muscle, the breast tissue was swung laterally and a lateral pedicle identified where breast tissue gave way to fat of axilla. The lateral pedicle was incised and the specimen removed.   The wound on both sides were irrigated and hemostasis was achieved. Closed suction drains on both sides were brought into the operative field through a separate stab incision and sutured to the skin with a 3-0 nylon suture. The wound was closed with interrupted 3-0 Vicryl to the subcutaneous layer, followed by a subcuticular layer of Monocryl 4-0. The wound was dressed.  The patient tolerated the procedure well and was taken to the postanesthesia care unit in stable condition.     Sentinel Node Biopsy Synoptic Operative Report  Operation performed with curative intent:Yes  Tracer(s) used to identify sentinel nodes in the upfront surgery (non-neoadjuvant) setting (select all that apply):Radioactive Tracer  Tracer(s) used to identify sentinel nodes in the neoadjuvant setting (select all that apply):N/A  All nodes (colored or non-colored) present at the  end of a dye-filled lymphatic channel were removed:N/A  All significantly radioactive nodes were removed:Yes  All palpable suspicious nodes were removed:N/A  Biopsy-proven positive nodes marked with clips prior to chemotherapy were identified and removed:N/A  Specimen:  Right and Left Breast                     Right axillary Sentinel lymph nodes #1, #2.   Complications: None  Estimated Blood Loss: 50 mL

## 2024-05-27 NOTE — Plan of Care (Signed)
  Problem: Pain Managment: Goal: General experience of comfort will improve and/or be controlled Outcome: Progressing   Problem: Safety: Goal: Ability to remain free from injury will improve Outcome: Progressing

## 2024-05-27 NOTE — Transfer of Care (Signed)
 Immediate Anesthesia Transfer of Care Note  Patient: Nina Dunn  Procedure(s) Performed: MASTECTOMY, SIMPLE (Bilateral: Breast) BIOPSY, LYMPH NODE, SENTINEL (Right)  Patient Location: PACU  Anesthesia Type:General  Level of Consciousness: awake, alert , and drowsy  Airway & Oxygen Therapy: Patient Spontanous Breathing and Patient connected to face mask oxygen  Post-op Assessment: Report given to RN and Post -op Vital signs reviewed and stable  Post vital signs: Reviewed and stable  Last Vitals:  Vitals Value Taken Time  BP 135/65 05/27/24 12:12  Temp 35.9 1212  Pulse 79 05/27/24 12:14  Resp 20 05/27/24 12:14  SpO2 96 % 05/27/24 12:14  Vitals shown include unfiled device data.  Last Pain:  Vitals:   05/27/24 0837  TempSrc: Temporal  PainSc: 0-No pain         Complications: No notable events documented.
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
## 2024-05-27 NOTE — Anesthesia Procedure Notes (Signed)
 Procedure Name: LMA Insertion Date/Time: 05/27/2024 9:01 AM  Performed by: Jackye Spanner, CRNAPre-anesthesia Checklist: Patient identified, Patient being monitored, Timeout performed, Emergency Drugs available and Suction available Patient Re-evaluated:Patient Re-evaluated prior to induction Oxygen Delivery Method: Circle system utilized Preoxygenation: Pre-oxygenation with 100% oxygen Induction Type: IV induction Ventilation: Mask ventilation without difficulty LMA: LMA inserted LMA Size: 4.0 Tube type: Oral Number of attempts: 1 Placement Confirmation: positive ETCO2 and breath sounds checked- equal and bilateral Tube secured with: Tape Dental Injury: Teeth and Oropharynx as per pre-operative assessment  Comments: Smooth atraumatic LMA placement, no complications noted.

## 2024-05-27 NOTE — Interval H&P Note (Signed)
 History and Physical Interval Note:  05/27/2024 8:32 AM  Nina Dunn  has presented today for surgery, with the diagnosis of C50.211 Malignant neoplasm of upper-inner quadrant of rt breast in female, unspecified estrogen receptor.  The various methods of treatment have been discussed with the patient and family. After consideration of risks, benefits and other options for treatment, the patient has consented to  Procedure(s): MASTECTOMY, SIMPLE (Bilateral) BIOPSY, LYMPH NODE, SENTINEL (Right) as a surgical intervention.  The patient's history has been reviewed, patient examined, no change in status, stable for surgery.  I have reviewed the patient's chart and labs.  Questions were answered to the patient's satisfaction.     Lucas Sjogren

## 2024-05-28 ENCOUNTER — Encounter: Payer: Self-pay | Admitting: General Surgery

## 2024-05-28 ENCOUNTER — Observation Stay

## 2024-05-28 DIAGNOSIS — C50911 Malignant neoplasm of unspecified site of right female breast: Secondary | ICD-10-CM | POA: Diagnosis not present

## 2024-05-28 LAB — CBC
HCT: 32 % — ABNORMAL LOW (ref 36.0–46.0)
Hemoglobin: 11.2 g/dL — ABNORMAL LOW (ref 12.0–15.0)
MCH: 31.1 pg (ref 26.0–34.0)
MCHC: 35 g/dL (ref 30.0–36.0)
MCV: 88.9 fL (ref 80.0–100.0)
Platelets: 192 K/uL (ref 150–400)
RBC: 3.6 MIL/uL — ABNORMAL LOW (ref 3.87–5.11)
RDW: 12.3 % (ref 11.5–15.5)
WBC: 7.7 K/uL (ref 4.0–10.5)
nRBC: 0 % (ref 0.0–0.2)

## 2024-05-28 LAB — BASIC METABOLIC PANEL WITH GFR
Anion gap: 8 (ref 5–15)
BUN: 9 mg/dL (ref 6–20)
CO2: 26 mmol/L (ref 22–32)
Calcium: 8.6 mg/dL — ABNORMAL LOW (ref 8.9–10.3)
Chloride: 107 mmol/L (ref 98–111)
Creatinine, Ser: 0.69 mg/dL (ref 0.44–1.00)
GFR, Estimated: >60 mL/min (ref >60)
Glucose, Bld: 96 mg/dL (ref 70–99)
Potassium: 3.7 mmol/L (ref 3.5–5.1)
Sodium: 141 mmol/L (ref 135–145)

## 2024-05-28 MED ORDER — HYDROCODONE-ACETAMINOPHEN 5-325 MG PO TABS: 1.0000 | ORAL_TABLET | Freq: Four times a day (QID) | ORAL | 0 refills | Status: AC | PRN

## 2024-05-28 NOTE — Discharge Summary (Signed)
  Patient ID: Nina Dunn MRN: [id number] DOB/AGE: 58-Jan-1967 58 y.o.  Admit date: 05/27/2024 Discharge date: 05/28/2024   Discharge Diagnoses:  Principal Problem:   Breast cancer [facility])   Procedures: Bilateral mastectomy with right axillary sentinel lymph node biopsy  Hospital Course: Patient admitted with right breast cancer.  She was treated with bilateral mastectomy with right axillary sentinel lymph node biopsy.  She has been tolerating the procedure well.  The pain is controlled.  Physical exam shows clean wounds without any sign of hematoma or fluid collection.  Drains with adequate output.  Drain serosanguineous.  Physical Exam Vitals reviewed.  Cardiovascular:     Rate and Rhythm: Normal rate.  Pulmonary:     Effort: Pulmonary effort is normal.  Chest:  Breasts:    Right: Absent.     Left: Absent.     Comments: Bilateral chest wounds dry and clean.  No hematoma.  No fluid collection.  No sign of ischemia. Abdominal:     General: Abdomen is flat.  Musculoskeletal:     Cervical back: Normal range of motion.  Skin:    General: Skin is warm.     Capillary Refill: Capillary refill takes less than 2 seconds.  Neurological:     Mental Status: She is alert and oriented to person, place, and time.      Consults: None  Disposition: Discharge disposition: 01-Home or Self Care       Discharge Instructions     Diet - low sodium heart healthy   Complete by: As directed    Increase activity slowly   Complete by: As directed       Allergies as of 05/28/2024       Reactions   Retinol Molecular Film [vitamin A] Anaphylaxis        Medication List     TAKE these medications    ASHWAGANDHA PO Take 800 mg by mouth daily.   Coenzyme Q10 300 MG Caps Take 300 mg by mouth daily.   HYDROcodone -acetaminophen  5-325 MG tablet Commonly known as: NORCO/VICODIN Take 1 tablet by mouth every 6 (six) hours as needed for up to 5 days.   hydroxychloroquine  200 MG  tablet Commonly known as: PLAQUENIL  Take 200 mg by mouth 2 (two) times daily.   leflunomide  20 MG tablet Commonly known as: ARAVA  Take 20 mg by mouth daily.   MORINGA PO Take 2 capsules by mouth daily.   Nattokinase 100 MG Caps Take 100 mg by mouth.   OVER THE COUNTER MEDICATION Take 2 tablets by mouth daily. TREMELLA   PROBIOTIC-PREBIOTIC PO Take 1 capsule by mouth daily.   triamcinolone cream 0.1 % Commonly known as: KENALOG Apply 1 Application topically daily as needed (irritation).   Zinc Acetate 50 MG Caps Take 50 mg by mouth daily.        Follow-up Information     Rodolph Romano, MD Follow up in 1 week(s).   Specialty: General Surgery Contact information: [address] [place] [address] [phone]

## 2024-05-28 NOTE — Anesthesia Postprocedure Evaluation (Signed)
 Anesthesia Post Note  Patient: Nina Dunn  Procedure(s) Performed: MASTECTOMY, SIMPLE (Bilateral: Breast) BIOPSY, LYMPH NODE, SENTINEL (Right)  Patient location during evaluation: PACU Anesthesia Type: General Level of consciousness: awake and alert Pain management: pain level controlled Vital Signs Assessment: post-procedure vital signs reviewed and stable Respiratory status: spontaneous breathing, nonlabored ventilation and respiratory function stable Cardiovascular status: blood pressure returned to baseline and stable Postop Assessment: no apparent nausea or vomiting Anesthetic complications: no   No notable events documented.   Last Vitals:  Vitals:   05/28/24 0724 05/28/24 1111  BP: (!) 141/70 137/67  Pulse: 64 68  Resp: 16 17  Temp: 37.1 C 36.9 C  SpO2: 95% 94%    Last Pain:  Vitals:   05/28/24 1111  TempSrc: Oral  PainSc: 0-No pain                 Camellia Merilee Louder

## 2024-05-28 NOTE — Plan of Care (Signed)
 Progressing towards discharge

## 2024-05-28 NOTE — Discharge Instructions (Addendum)

## 2024-05-28 NOTE — Progress Notes (Signed)
 DISCHARGE NOTE:  Pt given discharge instructions and verbalized understanding. 2 JP drains in place. Pt demonstrated emptying drains and verbalized understanding. Pt wheeled to car by staff, husband providing transportation.

## 2024-05-28 NOTE — Plan of Care (Signed)
  Problem: Pain Managment: Goal: General experience of comfort will improve and/or be controlled Outcome: Progressing   Problem: Safety: Goal: Ability to remain free from injury will improve Outcome: Progressing

## 2024-05-29 LAB — SURGICAL PATHOLOGY

## 2024-06-01 ENCOUNTER — Encounter: Payer: Self-pay | Admitting: *Deleted

## 2024-06-01 NOTE — Progress Notes (Signed)
 Oncotype Dx order [id number] submitted online.

## 2024-06-03 ENCOUNTER — Encounter: Payer: Self-pay | Admitting: Obstetrics

## 2024-06-03 ENCOUNTER — Ambulatory Visit (INDEPENDENT_AMBULATORY_CARE_PROVIDER_SITE_OTHER): Admitting: Obstetrics

## 2024-06-03 ENCOUNTER — Inpatient Hospital Stay: Admitting: Licensed Clinical Social Worker

## 2024-06-03 ENCOUNTER — Encounter: Payer: Self-pay | Admitting: Licensed Clinical Social Worker

## 2024-06-03 VITALS — BP 143/72 | HR 77 | Ht 66.0 in | Wt 182.0 lb

## 2024-06-03 DIAGNOSIS — Z9013 Acquired absence of bilateral breasts and nipples: Secondary | ICD-10-CM | POA: Diagnosis not present

## 2024-06-03 DIAGNOSIS — Z01419 Encounter for gynecological examination (general) (routine) without abnormal findings: Secondary | ICD-10-CM | POA: Diagnosis not present

## 2024-06-03 DIAGNOSIS — Z170 Estrogen receptor positive status [ER+]: Secondary | ICD-10-CM

## 2024-06-03 DIAGNOSIS — Z803 Family history of malignant neoplasm of breast: Secondary | ICD-10-CM

## 2024-06-03 DIAGNOSIS — Z131 Encounter for screening for diabetes mellitus: Secondary | ICD-10-CM

## 2024-06-03 DIAGNOSIS — Z1321 Encounter for screening for nutritional disorder: Secondary | ICD-10-CM

## 2024-06-03 DIAGNOSIS — Z1159 Encounter for screening for other viral diseases: Secondary | ICD-10-CM

## 2024-06-03 DIAGNOSIS — Z13220 Encounter for screening for lipoid disorders: Secondary | ICD-10-CM

## 2024-06-03 DIAGNOSIS — Z1329 Encounter for screening for other suspected endocrine disorder: Secondary | ICD-10-CM
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
## 2024-06-03 NOTE — Patient Instructions (Signed)
 There are several treatment options for vaginal dryness. Some, such as vaginal moisturizers or lubricants, are available without a prescription.  Vaginal lubricants and moisturizers -- You can buy these without a prescription in most pharmacies. Vaginal lubricants and moisturizers do not contain any hormones and have virtually no systemic (body-wide) side effects. Possible local side effects include irritation or a burning feeling after application.  ?Lubricants are designed to reduce friction and discomfort from dryness during sexual intercourse. The lubricant is applied inside the vagina and/or on the partner's penis or fingers just before sex. Products sold specifically as vaginal lubricants are more effective than lubricants that are not designed for this purpose, such as petroleum jelly. In addition, oil-based lubricants (like petroleum jelly, baby oil, or mineral oil) can damage latex condoms and/or diaphragms and make them less effective in preventing pregnancy or sexually transmitted infections. Lubricants that are made with water or silicones can be used with latex condoms and diaphragms. Polyurethane condoms can be used with oil-based products. Natural lubricants, such as olive, coconut, avocado, or peanut oil, are easily available products that may be used as a lubricant with sex. However, it is important to know that, like the oil-based lubricants, natural oils are not recommended for use with latex condoms or diaphragms, as they can damage the latex. Water- or silicone-based lubricants are a better choice if you use condoms or a diaphragm.  ?Vaginal moisturizers usually include hyaluronic acid and are formulated to allow the vaginal tissues to retain moisture more effectively. Moisturizers are applied into the vagina (as a gel or suppository) approximately three times weekly to allow a continuous moisturizing effect. Be sure to check the label to ensure that you are purchasing  a moisturizer and not a lubricant. These can be found at your local drug stores and online.  Both lubricants and moisturizers are also available in preservative-free forms.  Hand and body lotions and moisturizers should not be used to relieve vaginal dryness since they can be irritating to the vaginal tissues

## 2024-06-10 ENCOUNTER — Telehealth: Payer: Self-pay | Admitting: Licensed Clinical Social Worker

## 2024-06-10 ENCOUNTER — Encounter: Payer: Self-pay | Admitting: Licensed Clinical Social Worker

## 2024-06-10 ENCOUNTER — Ambulatory Visit: Attending: Oncology | Admitting: Occupational Therapy

## 2024-06-10 ENCOUNTER — Ambulatory Visit: Payer: Self-pay | Admitting: Licensed Clinical Social Worker

## 2024-06-10 DIAGNOSIS — Z1379 Encounter for other screening for genetic and chromosomal anomalies: Secondary | ICD-10-CM

## 2024-06-10 DIAGNOSIS — M25611 Stiffness of right shoulder, not elsewhere classified: Secondary | ICD-10-CM | POA: Diagnosis present

## 2024-06-10 DIAGNOSIS — M25612 Stiffness of left shoulder, not elsewhere classified: Secondary | ICD-10-CM | POA: Insufficient documentation

## 2024-06-10 DIAGNOSIS — R293 Abnormal posture: Secondary | ICD-10-CM | POA: Diagnosis present

## 2024-06-10 NOTE — Progress Notes (Signed)
 HPI:   Nina Dunn was previously seen in the [facility] clinic due to a personal and family history of cancer and concerns regarding a hereditary predisposition to cancer. Please refer to our prior cancer genetics clinic note for more information regarding our discussion, assessment and recommendations, at the time. Nina Dunn's recent genetic test results were disclosed to her, as were recommendations warranted by these results. These results and recommendations are discussed in more detail below.  CANCER HISTORY:  Oncology History  Malignant neoplasm of upper-inner quadrant of right breast in female, estrogen receptor positive (HCC)  05/24/2024 Initial Diagnosis   Malignant neoplasm of upper-inner quadrant of right breast in female, estrogen receptor positive (HCC)   05/24/2024 Cancer Staging   Staging form: Breast, AJCC 8th Edition - Clinical stage from 05/24/2024: Stage IA (cT1b, cN0, cM0, G2, ER+, PR+, HER2-) - Signed by Melanee Annah BROCKS, MD on 05/24/2024 Histologic grading system: 3 grade system   06/08/2024 Genetic Testing   No pathogenic variants identified on the [facility] CancerNext+RNA Panel. VUS in NF1 called c.1846-2A>G identified. The report date is 06/08/2024.  The [facility] CancerNext+RNAinsight Panel includes sequencing, rearrangement analysis, and RNA analysis for the following 40 genes: APC, ATM, BAP1, BARD1, BMPR1A, BRCA1, BRCA2, BRIP1, CDH1, CDKN2A, CHEK2, FH, FLCN, MET, MLH1, MSH2, MSH6, MUTYH, NF1, NTHL1, PALB2, PMS2, PTEN, RAD51C, RAD51D, RPS20, SMAD4, STK11, TP53, TSC1, TSC2, and VHL (sequencing and deletion/duplication); AXIN2, HOXB13, MBD4, MSH3, POLD1 and POLE (sequencing only); EPCAM and GREM1 (deletion/duplication only).     FAMILY HISTORY:  We obtained a detailed, 4-generation family history.  Significant diagnoses are listed below: Family History  Problem Relation Age of Onset   Hypertension Father    Breast cancer Paternal Aunt 60       mets to brain    Breast cancer Paternal Aunt 55    Nina Dunn has 2 sons, 8 and 31. She has 2 sisters and 1 brother.   Nina Dunn's mother passed at 82. No known cancers on this side of the family.   Ms. Fleeta Dunn's father passed at 29. Patient had 2 paternal aunts, both had breast cancer in their 8s, one had metastatic cancer.    Nina Dunn is unaware of previous family history of genetic testing for hereditary cancer risks. There is no reported Ashkenazi Jewish ancestry. There is no known consanguinity.     GENETIC TEST RESULTS:  The [facility] Cancer[facility]+RNA Panel found no pathogenic mutations.   The [facility] CancerNext+RNAinsight Panel includes sequencing, rearrangement analysis, and RNA analysis for the following 40 genes: APC, ATM, BAP1, BARD1, BMPR1A, BRCA1, BRCA2, BRIP1, CDH1, CDKN2A, CHEK2, FH, FLCN, MET, MLH1, MSH2, MSH6, MUTYH, NF1, NTHL1, PALB2, PMS2, PTEN, RAD51C, RAD51D, RPS20, SMAD4, STK11, TP53, TSC1, TSC2, and VHL (sequencing and deletion/duplication); AXIN2, HOXB13, MBD4, MSH3, POLD1 and POLE (sequencing only); EPCAM and GREM1 (deletion/duplication only).   The test report has been scanned into EPIC and is located under the Molecular Pathology section of the Results Review tab.  A portion of the result report is included below for reference. Genetic testing reported out on 06/08/2024.     Genetic testing identified a variant of uncertain significance (VUS) in the NF1 gene called c.1846-2A>G.  At this time, it is unknown if this variant is associated with an increased risk for cancer or if it is benign, but most uncertain variants are reclassified to benign. It should not be used to make medical management decisions. With time, we suspect the laboratory  will determine the significance of this variant, if any. If the laboratory reclassifies this variant, we will attempt to contact Nina Dunn to discuss it further.   Even though a pathogenic variant was not identified, possible explanations for the  cancer in the family may include: There may be no hereditary risk for cancer in the family. The cancers in Nina Dunn and/or her family may be sporadic/familial or due to other genetic and environmental factors. There may be a gene mutation in one of these genes that current testing methods cannot detect but that chance is small. There could be another gene that has not yet been discovered, or that we have not yet tested, that is responsible for the cancer diagnoses in the family.  It is also possible there is a hereditary cause for the cancer in the family that Nina Dunn did not inherit Therefore, it is important to remain in touch with cancer genetics in the future so that we can continue to offer Nina Dunn the most up to date genetic testing.   ADDITIONAL GENETIC TESTING:  We discussed with Nina Dunn that her genetic testing was fairly extensive.  If there are additional relevant genes identified to increase cancer risk that can be analyzed in the future, we would be happy to discuss and coordinate this testing at that time.    CANCER SCREENING RECOMMENDATIONS:  Ms. Fleeta Dunn's test result is considered negative (normal).  This means that we have not identified a hereditary cause for her personal and family history of cancer at this time.   An individual's cancer risk and medical management are not determined by genetic test results alone. Overall cancer risk assessment incorporates additional factors, including personal medical history, family history, and any available genetic information that may result in a personalized plan for cancer prevention and surveillance. Therefore, it is recommended she continue to follow the cancer management and screening guidelines provided by her oncology and primary healthcare provider.  RECOMMENDATIONS FOR FAMILY MEMBERS:   Since she did not inherit a identifiable mutation in a cancer predisposition gene included on this panel, her children could not have  inherited a known mutation from her in one of these genes. Individuals in this family might be at some increased risk of developing cancer, over the general population risk, due to the family history of cancer.  Individuals in the family should notify their providers of the family history of cancer. We recommend women in this family have a yearly mammogram beginning at age 7, or 1 years younger than the earliest onset of cancer, an annual clinical breast exam, and perform monthly breast self-exams.  Family members should have colonoscopies by at age 61, or earlier, as recommended by their providers. We do not recommend familial testing for the NF1 variant of uncertain significance (VUS).  FOLLOW-UP:  Lastly, we discussed with Nina Dunn that cancer genetics is a rapidly advancing field and it is possible that new genetic tests will be appropriate for her and/or her family members in the future. We encouraged her to remain in contact with cancer genetics on an annual basis so we can update her personal and family histories and let her know of advances in cancer genetics that may benefit this family.   Our contact number was provided. Ms. Fleeta Dunn's questions were answered to her satisfaction, and she knows she is welcome to call us  at anytime with additional questions or concerns.    Dena Cary, MS,  [facility] Genetic Counselor [email or web address] Phone: [phone]

## 2024-06-10 NOTE — Telephone Encounter (Signed)
 I contacted Ms. Van Zyl to discuss her genetic testing results. No pathogenic variants were identified in the 40 genes analyzed. Detailed clinic note to follow.   The test report has been scanned into EPIC and is located under the Molecular Pathology section of the Results Review tab.  A portion of the result report is included below for reference.      Dena Cary, MS, [facility] Genetic Counselor [email or web address] Phone: [phone]
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
## 2024-06-11 ENCOUNTER — Encounter: Payer: Self-pay | Admitting: Oncology

## 2024-06-11 ENCOUNTER — Ambulatory Visit: Payer: Self-pay | Admitting: Oncology

## 2024-06-11 NOTE — Telephone Encounter (Signed)
 Per Dr. Melanee Please let her know she does not need chemotherapy based on oncotype score.  Outbound call; informed patient of above.  Patient is inquiring (1) does she still need to do radiation and (2) when would you like follow up with her if needed.  Informed patient Dr. Melanee is out of office until next week and would forward her questions. Patient verbalized understanding.

## 2024-06-17 ENCOUNTER — Inpatient Hospital Stay: Attending: Oncology | Admitting: Hospice and Palliative Medicine

## 2024-06-17 DIAGNOSIS — Z170 Estrogen receptor positive status [ER+]: Secondary | ICD-10-CM | POA: Insufficient documentation

## 2024-06-17 DIAGNOSIS — Z1721 Progesterone receptor positive status: Secondary | ICD-10-CM | POA: Insufficient documentation

## 2024-06-17 DIAGNOSIS — Q8501 Neurofibromatosis, type 1: Secondary | ICD-10-CM | POA: Insufficient documentation

## 2024-06-17 DIAGNOSIS — Z1732 Human epidermal growth factor receptor 2 negative status: Secondary | ICD-10-CM | POA: Insufficient documentation

## 2024-06-17 DIAGNOSIS — Z9049 Acquired absence of other specified parts of digestive tract: Secondary | ICD-10-CM | POA: Insufficient documentation

## 2024-06-17 DIAGNOSIS — C50211 Malignant neoplasm of upper-inner quadrant of right female breast: Secondary | ICD-10-CM | POA: Insufficient documentation

## 2024-06-17 DIAGNOSIS — Z8249 Family history of ischemic heart disease and other diseases of the circulatory system: Secondary | ICD-10-CM | POA: Insufficient documentation

## 2024-06-17 DIAGNOSIS — Z9013 Acquired absence of bilateral breasts and nipples: Secondary | ICD-10-CM | POA: Insufficient documentation

## 2024-06-17 DIAGNOSIS — Z803 Family history of malignant neoplasm of breast: Secondary | ICD-10-CM | POA: Insufficient documentation

## 2024-06-17 DIAGNOSIS — Z79899 Other long term (current) drug therapy: Secondary | ICD-10-CM | POA: Insufficient documentation

## 2024-06-17 NOTE — Progress Notes (Signed)
 Multidisciplinary Oncology Council Documentation  Nina Dunn was presented by our [facility] on 06/17/2024, which included representatives from:  Palliative Care Dietitian  Physical/Occupational Therapist Nurse Navigator Genetics Social work Survivorship RN Financial Navigator Research RN   [name] currently presents with history of breast cancer  We reviewed previous medical and familial history, history of present illness, and recent lab results along with all available histopathologic and imaging studies. The MOC considered available treatment options and made the following recommendations/referrals:  Rehab screening  The MOC is a meeting of clinicians from various specialty areas who evaluate and discuss patients for whom a multidisciplinary approach is being considered. Final determinations in the plan of care are those of the provider(s).   Today's extended care, comprehensive team conference, [name] was not present for the discussion and was not examined.

## 2024-06-18 NOTE — Telephone Encounter (Signed)
 Patient had conversation with Josh yesterday 06/18/24.

## 2024-06-30 ENCOUNTER — Inpatient Hospital Stay (HOSPITAL_BASED_OUTPATIENT_CLINIC_OR_DEPARTMENT_OTHER): Admitting: Oncology

## 2024-06-30 ENCOUNTER — Encounter: Payer: Self-pay | Admitting: Oncology

## 2024-06-30 VITALS — BP 110/68 | HR 70 | Temp 97.1°F | Resp 16 | Wt 181.5 lb

## 2024-06-30 DIAGNOSIS — Z9013 Acquired absence of bilateral breasts and nipples: Secondary | ICD-10-CM | POA: Diagnosis not present

## 2024-06-30 DIAGNOSIS — C50211 Malignant neoplasm of upper-inner quadrant of right female breast: Secondary | ICD-10-CM | POA: Diagnosis present

## 2024-06-30 DIAGNOSIS — Z8249 Family history of ischemic heart disease and other diseases of the circulatory system: Secondary | ICD-10-CM | POA: Diagnosis not present

## 2024-06-30 DIAGNOSIS — Z170 Estrogen receptor positive status [ER+]: Secondary | ICD-10-CM

## 2024-06-30 DIAGNOSIS — Z9049 Acquired absence of other specified parts of digestive tract: Secondary | ICD-10-CM | POA: Diagnosis not present

## 2024-06-30 DIAGNOSIS — Z1732 Human epidermal growth factor receptor 2 negative status: Secondary | ICD-10-CM | POA: Diagnosis not present

## 2024-06-30 DIAGNOSIS — Q8501 Neurofibromatosis, type 1: Secondary | ICD-10-CM | POA: Diagnosis not present

## 2024-06-30 DIAGNOSIS — Z803 Family history of malignant neoplasm of breast: Secondary | ICD-10-CM | POA: Diagnosis not present

## 2024-06-30 DIAGNOSIS — Z79899 Other long term (current) drug therapy: Secondary | ICD-10-CM | POA: Diagnosis not present

## 2024-06-30 DIAGNOSIS — Z1721 Progesterone receptor positive status: Secondary | ICD-10-CM | POA: Diagnosis not present

## 2024-06-30 NOTE — Progress Notes (Signed)
 Pt in for follow up, reports no pain but if she does not have binder on very uncomfortable.  Pt incisions are healing well bilaterally.

## 2024-06-30 NOTE — Progress Notes (Signed)
 Provided Letrozole  and Tamoxifen handouts for patient to review.
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
## 2024-07-01 ENCOUNTER — Telehealth: Payer: Self-pay

## 2024-07-01 DIAGNOSIS — Z170 Estrogen receptor positive status [ER+]: Secondary | ICD-10-CM

## 2024-07-01 MED ORDER — LETROZOLE 2.5 MG PO TABS: 2.5000 mg | ORAL_TABLET | Freq: Every day | ORAL | 2 refills | Status: DC

## 2024-07-01 NOTE — Telephone Encounter (Signed)
 Calcium will be OTC with vit D 1200 mg calcium and 800 international units of Vit D daily.

## 2024-07-01 NOTE — Telephone Encounter (Signed)
 Voicemail received from patient today 07/01/24 at 11:28am stating patient would like to move forward with Letrozole  and would like a calcium supplement prescribed along with it to prevent osteoporosis.  [facility] next to [address] is her pharmacy.    Dr. Melanee I have already sent her Letrozole ; can you please advise on the calcium supplement.

## 2024-07-01 NOTE — Telephone Encounter (Signed)
 Per Dr. Melanee Calcium will be OTC with vit D 1200 mg calcium and 800 international units of Vit D daily.SABRA Finch call; voice message left for patient to check my chart message

## 2024-07-06 ENCOUNTER — Encounter: Payer: Self-pay | Admitting: *Deleted

## 2024-07-30 ENCOUNTER — Other Ambulatory Visit: Payer: Self-pay | Admitting: Oncology

## 2024-07-30 DIAGNOSIS — C50211 Malignant neoplasm of upper-inner quadrant of right female breast: Secondary | ICD-10-CM

## 2024-08-24 ENCOUNTER — Telehealth: Payer: Self-pay

## 2024-08-24 DIAGNOSIS — C50211 Malignant neoplasm of upper-inner quadrant of right female breast: Secondary | ICD-10-CM

## 2024-08-24 MED ORDER — EXEMESTANE 25 MG PO TABS: 25.0000 mg | ORAL_TABLET | Freq: Every day | ORAL | 2 refills | Status: DC

## 2024-08-24 NOTE — Telephone Encounter (Signed)
 Dr. Melanee prescribed Letrozole  which is causing her to have hot flashes.  Patient states that Dr. Melanee told her there are 2 other medication options.  She would like to know other options and possible side effects.

## 2024-08-24 NOTE — Telephone Encounter (Signed)
 She can stop her letrozole  for about 3 weeks and start taking exemestane after Thanksgiving.  Please send her a prescription for the same?

## 2024-08-25 ENCOUNTER — Ambulatory Visit
Admission: RE | Admit: 2024-08-25 | Discharge: 2024-08-25 | Disposition: A | Discharge status: Home / Self Care | Source: Ambulatory Visit | Attending: Oncology | Admitting: Oncology

## 2024-08-25 DIAGNOSIS — Z170 Estrogen receptor positive status [ER+]: Secondary | ICD-10-CM | POA: Diagnosis present

## 2024-08-25 DIAGNOSIS — C50211 Malignant neoplasm of upper-inner quadrant of right female breast: Secondary | ICD-10-CM | POA: Diagnosis present

## 2024-08-26 MED ORDER — LETROZOLE 2.5 MG PO TABS: 2.5000 mg | ORAL_TABLET | Freq: Every day | ORAL | 0 refills | Status: AC

## 2024-08-26 NOTE — Addendum Note (Signed)
 Addended by: CLAUDENE POWELL CROME on: 08/26/2024 03:40 PM   Modules accepted: Orders

## 2024-09-21 ENCOUNTER — Encounter: Payer: Self-pay | Admitting: Oncology

## 2024-09-21 ENCOUNTER — Inpatient Hospital Stay: Attending: Oncology | Admitting: Oncology

## 2024-09-21 ENCOUNTER — Encounter: Payer: Self-pay | Admitting: *Deleted

## 2024-09-21 ENCOUNTER — Telehealth: Payer: Self-pay | Admitting: *Deleted

## 2024-09-21 VITALS — BP 109/64 | HR 67 | Temp 97.4°F | Resp 18 | Ht 66.0 in | Wt 184.6 lb

## 2024-09-21 DIAGNOSIS — Z7969 Long term (current) use of other immunomodulators and immunosuppressants: Secondary | ICD-10-CM | POA: Diagnosis not present

## 2024-09-21 DIAGNOSIS — R232 Flushing: Secondary | ICD-10-CM | POA: Diagnosis not present

## 2024-09-21 DIAGNOSIS — Z9013 Acquired absence of bilateral breasts and nipples: Secondary | ICD-10-CM | POA: Diagnosis not present

## 2024-09-21 DIAGNOSIS — Z1732 Human epidermal growth factor receptor 2 negative status: Secondary | ICD-10-CM | POA: Diagnosis not present

## 2024-09-21 DIAGNOSIS — N951 Menopausal and female climacteric states: Secondary | ICD-10-CM | POA: Insufficient documentation

## 2024-09-21 DIAGNOSIS — Z170 Estrogen receptor positive status [ER+]: Secondary | ICD-10-CM | POA: Insufficient documentation

## 2024-09-21 DIAGNOSIS — Z9049 Acquired absence of other specified parts of digestive tract: Secondary | ICD-10-CM | POA: Diagnosis not present

## 2024-09-21 DIAGNOSIS — Z1721 Progesterone receptor positive status: Secondary | ICD-10-CM | POA: Insufficient documentation

## 2024-09-21 DIAGNOSIS — Z91148 Patient's other noncompliance with medication regimen for other reason: Secondary | ICD-10-CM | POA: Insufficient documentation

## 2024-09-21 DIAGNOSIS — Z79899 Other long term (current) drug therapy: Secondary | ICD-10-CM | POA: Diagnosis not present

## 2024-09-21 DIAGNOSIS — Q8501 Neurofibromatosis, type 1: Secondary | ICD-10-CM | POA: Diagnosis not present

## 2024-09-21 DIAGNOSIS — Z79811 Long term (current) use of aromatase inhibitors: Secondary | ICD-10-CM | POA: Diagnosis not present

## 2024-09-21 DIAGNOSIS — Z803 Family history of malignant neoplasm of breast: Secondary | ICD-10-CM | POA: Diagnosis not present

## 2024-09-21 DIAGNOSIS — Z8249 Family history of ischemic heart disease and other diseases of the circulatory system: Secondary | ICD-10-CM | POA: Insufficient documentation

## 2024-09-21 DIAGNOSIS — C50211 Malignant neoplasm of upper-inner quadrant of right female breast: Secondary | ICD-10-CM | POA: Diagnosis present

## 2024-09-21 NOTE — Telephone Encounter (Signed)
 Pt called and stated she was seen by Dr Melanee this morning.  Pt has follow up appointment for lab and MD on 01/29/25.  Pt request lab appointment be cancelled and an [facility] requisition form be filled out and send to her for April lab work.

## 2024-09-21 NOTE — Progress Notes (Signed)
 Patient has a few concerns she would like address today.
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
## 2024-09-28 ENCOUNTER — Ambulatory Visit: Admitting: Oncology

## 2024-10-19 ENCOUNTER — Inpatient Hospital Stay: Attending: Oncology | Admitting: *Deleted

## 2024-10-19 DIAGNOSIS — C50211 Malignant neoplasm of upper-inner quadrant of right female breast: Secondary | ICD-10-CM | POA: Insufficient documentation

## 2024-10-19 DIAGNOSIS — Z9189 Other specified personal risk factors, not elsewhere classified: Secondary | ICD-10-CM

## 2024-10-19 DIAGNOSIS — Z170 Estrogen receptor positive status [ER+]: Secondary | ICD-10-CM | POA: Insufficient documentation

## 2024-10-19 NOTE — Progress Notes (Signed)
 SUBJECTIVE: Pt returns for her 3 month L-Dex screen.    PAIN:  Are you having pain? No   SOZO SCREENING: Patient was assessed today using the SOZO machine to determine the lymphedema index score. This was compared to her baseline score. It was determined that she is within the recommended range when compared to her baseline and no further action is needed at this time. She will continue SOZO screenings. These are done every 3 months for 2 years post operatively followed by every 6 months for 2 years, and then annually.     L-DEX FLOWSHEETS                L-DEX LYMPHEDEMA SCREENING    Measurement Type Unilateral     L-DEX MEASUREMENT EXTREMITY Upper Extremity     POSITION  Standing     DOMINANT SIDE Right     At Risk Side Right    BASELINE SCORE (UNILATERAL) 2.5    L-DEX SCORE (UNILATERAL) 0.1    VALUE CHANGE (UNILAT) -2.4

## 2025-01-18 ENCOUNTER — Inpatient Hospital Stay: Admitting: *Deleted

## 2025-01-22 ENCOUNTER — Inpatient Hospital Stay: Admitting: Oncology

## 2025-01-22 ENCOUNTER — Inpatient Hospital Stay

## 2025-01-29 ENCOUNTER — Inpatient Hospital Stay: Admitting: Oncology

## 2025-01-29 ENCOUNTER — Inpatient Hospital Stay

## 2025-01-29 ENCOUNTER — Inpatient Hospital Stay: Admitting: *Deleted
